# Patient Record
Sex: Female | Born: 1975 | Race: White | Hispanic: No | Marital: Married | Smoking: Never smoker
Health system: Southern US, Community
[De-identification: ages and names within clinical notes are randomized; demographics above are authoritative.]

## PROBLEM LIST (undated history)

## (undated) DIAGNOSIS — E079 Disorder of thyroid, unspecified: Secondary | ICD-10-CM

## (undated) DIAGNOSIS — N751 Abscess of Bartholin's gland: Secondary | ICD-10-CM

## (undated) DIAGNOSIS — E039 Hypothyroidism, unspecified: Secondary | ICD-10-CM

## (undated) DIAGNOSIS — Z973 Presence of spectacles and contact lenses: Secondary | ICD-10-CM

## (undated) HISTORY — PX: WISDOM TOOTH EXTRACTION: SHX21

## (undated) HISTORY — PX: RETINAL DETACHMENT SURGERY: SHX105

---

## 1997-12-08 HISTORY — PX: WISDOM TOOTH EXTRACTION: SHX21

## 2000-01-07 ENCOUNTER — Other Ambulatory Visit: Admission: RE | Admit: 2000-01-07 | Discharge: 2000-01-07 | Payer: Self-pay | Admitting: Obstetrics and Gynecology

## 2001-01-07 ENCOUNTER — Other Ambulatory Visit: Admission: RE | Admit: 2001-01-07 | Discharge: 2001-01-07 | Payer: Self-pay | Admitting: Obstetrics and Gynecology

## 2002-01-12 ENCOUNTER — Other Ambulatory Visit: Admission: RE | Admit: 2002-01-12 | Discharge: 2002-01-12 | Payer: Self-pay | Admitting: Obstetrics and Gynecology

## 2003-01-23 ENCOUNTER — Other Ambulatory Visit: Admission: RE | Admit: 2003-01-23 | Discharge: 2003-01-23 | Payer: Self-pay | Admitting: Obstetrics and Gynecology

## 2004-01-25 ENCOUNTER — Other Ambulatory Visit: Admission: RE | Admit: 2004-01-25 | Discharge: 2004-01-25 | Payer: Self-pay | Admitting: Obstetrics and Gynecology

## 2005-01-29 ENCOUNTER — Other Ambulatory Visit: Admission: RE | Admit: 2005-01-29 | Discharge: 2005-01-29 | Payer: Self-pay | Admitting: Obstetrics and Gynecology

## 2005-04-14 ENCOUNTER — Ambulatory Visit (HOSPITAL_COMMUNITY): Admission: RE | Admit: 2005-04-14 | Discharge: 2005-04-14 | Payer: Self-pay | Admitting: Obstetrics and Gynecology

## 2006-06-23 ENCOUNTER — Ambulatory Visit: Payer: Self-pay | Admitting: Endocrinology

## 2006-06-30 ENCOUNTER — Ambulatory Visit: Payer: Self-pay

## 2006-06-30 ENCOUNTER — Encounter: Payer: Self-pay | Admitting: Cardiology

## 2006-07-06 ENCOUNTER — Ambulatory Visit: Payer: Self-pay | Admitting: Endocrinology

## 2006-07-07 ENCOUNTER — Ambulatory Visit: Payer: Self-pay | Admitting: Endocrinology

## 2006-07-10 ENCOUNTER — Ambulatory Visit (HOSPITAL_COMMUNITY): Admission: RE | Admit: 2006-07-10 | Discharge: 2006-07-10 | Payer: Self-pay | Admitting: Endocrinology

## 2006-07-24 ENCOUNTER — Ambulatory Visit: Payer: Self-pay | Admitting: Endocrinology

## 2007-01-26 ENCOUNTER — Ambulatory Visit: Payer: Self-pay | Admitting: Endocrinology

## 2007-07-09 ENCOUNTER — Encounter: Payer: Self-pay | Admitting: Endocrinology

## 2007-07-09 DIAGNOSIS — E039 Hypothyroidism, unspecified: Secondary | ICD-10-CM | POA: Insufficient documentation

## 2007-07-27 ENCOUNTER — Ambulatory Visit: Payer: Self-pay | Admitting: Endocrinology

## 2007-10-29 ENCOUNTER — Ambulatory Visit: Payer: Self-pay | Admitting: Endocrinology

## 2007-11-01 ENCOUNTER — Telehealth (INDEPENDENT_AMBULATORY_CARE_PROVIDER_SITE_OTHER): Payer: Self-pay | Admitting: *Deleted

## 2007-11-01 LAB — CONVERTED CEMR LAB: TSH: 2.42 microintl units/mL (ref 0.35–5.50)

## 2008-01-18 ENCOUNTER — Telehealth (INDEPENDENT_AMBULATORY_CARE_PROVIDER_SITE_OTHER): Payer: Self-pay | Admitting: *Deleted

## 2008-01-24 ENCOUNTER — Encounter: Payer: Self-pay | Admitting: Endocrinology

## 2008-01-27 ENCOUNTER — Ambulatory Visit: Payer: Self-pay | Admitting: Endocrinology

## 2008-03-29 ENCOUNTER — Telehealth (INDEPENDENT_AMBULATORY_CARE_PROVIDER_SITE_OTHER): Payer: Self-pay | Admitting: *Deleted

## 2008-04-06 ENCOUNTER — Encounter: Payer: Self-pay | Admitting: Endocrinology

## 2008-05-03 ENCOUNTER — Ambulatory Visit: Payer: Self-pay | Admitting: Endocrinology

## 2008-06-30 ENCOUNTER — Encounter: Payer: Self-pay | Admitting: Endocrinology

## 2008-06-30 ENCOUNTER — Telehealth: Payer: Self-pay | Admitting: Endocrinology

## 2008-07-19 ENCOUNTER — Telehealth (INDEPENDENT_AMBULATORY_CARE_PROVIDER_SITE_OTHER): Payer: Self-pay | Admitting: *Deleted

## 2008-08-31 ENCOUNTER — Inpatient Hospital Stay (HOSPITAL_COMMUNITY): Admission: AD | Admit: 2008-08-31 | Discharge: 2008-09-03 | Payer: Self-pay | Admitting: Obstetrics and Gynecology

## 2008-08-31 ENCOUNTER — Encounter (INDEPENDENT_AMBULATORY_CARE_PROVIDER_SITE_OTHER): Payer: Self-pay | Admitting: Obstetrics and Gynecology

## 2008-09-13 ENCOUNTER — Telehealth: Payer: Self-pay | Admitting: Endocrinology

## 2008-10-14 ENCOUNTER — Encounter: Payer: Self-pay | Admitting: Endocrinology

## 2008-10-26 ENCOUNTER — Ambulatory Visit: Payer: Self-pay | Admitting: Endocrinology

## 2008-11-16 ENCOUNTER — Encounter: Payer: Self-pay | Admitting: Endocrinology

## 2008-11-17 ENCOUNTER — Telehealth: Payer: Self-pay | Admitting: Endocrinology

## 2008-11-20 ENCOUNTER — Telehealth (INDEPENDENT_AMBULATORY_CARE_PROVIDER_SITE_OTHER): Payer: Self-pay | Admitting: *Deleted

## 2009-01-05 ENCOUNTER — Encounter: Payer: Self-pay | Admitting: Endocrinology

## 2009-01-10 ENCOUNTER — Telehealth (INDEPENDENT_AMBULATORY_CARE_PROVIDER_SITE_OTHER): Payer: Self-pay | Admitting: *Deleted

## 2009-04-05 ENCOUNTER — Encounter: Payer: Self-pay | Admitting: Endocrinology

## 2009-04-10 ENCOUNTER — Encounter: Payer: Self-pay | Admitting: Endocrinology

## 2009-04-20 ENCOUNTER — Telehealth: Payer: Self-pay | Admitting: Internal Medicine

## 2009-05-08 ENCOUNTER — Ambulatory Visit: Payer: Self-pay | Admitting: Endocrinology

## 2009-05-25 ENCOUNTER — Encounter: Payer: Self-pay | Admitting: Endocrinology

## 2010-05-13 ENCOUNTER — Ambulatory Visit: Payer: Self-pay | Admitting: Endocrinology

## 2010-05-14 ENCOUNTER — Telehealth: Payer: Self-pay | Admitting: Endocrinology

## 2010-05-17 ENCOUNTER — Telehealth: Payer: Self-pay | Admitting: Endocrinology

## 2010-05-24 ENCOUNTER — Telehealth (INDEPENDENT_AMBULATORY_CARE_PROVIDER_SITE_OTHER): Payer: Self-pay | Admitting: *Deleted

## 2011-01-07 NOTE — Progress Notes (Signed)
  Phone Note Call from Patient   Caller: Patient Reason for Call: Lab or Test Results Action Taken: Phone Call Completed Details for Reason: Patient wanted her lab test results mailed to the address provided on the medical release form she completed. Details of Complaint: Patient is stating that in stead of receiving her labs in the mail, she received another release form. Summary of Call: Autumn Duffy called earlier in the week stating that she wanted her labs mailed to her. I explained to the patient that she needed to complete a medical release form. The patient the notified me that she had completed one already. I asked for her name, after researching, I realized I had mailed her results the same day and advised the patient of that. Autumn Duffy called back today, 05/24/10 stating that she had not received her lab results, instead she received another medical release. I advised her that based on the most current release form, it shows I mailed 3 pages to the address provided on the release. Initial call taken by: Wilder Glade,  May 24, 2010 10:05 AM

## 2011-01-07 NOTE — Progress Notes (Signed)
Summary: PT?  Phone Note Call from Patient   Caller: Patient [phone] Summary of Call: pt called stating that she forgot to inform MD at last OV that she has been having irregular menses with cycles longer than 28 days, sometimes 35 days. Please advise? Initial call taken by: Margaret Pyle, CMA,  May 14, 2010 2:37 PM  Follow-up for Phone Call        thank you.  thyroid blood test is right down the middle.  please continue same medication. Follow-up by: Minus Breeding MD,  May 14, 2010 3:46 PM  Additional Follow-up for Phone Call Additional follow up Details #1::        pt informed Additional Follow-up by: Margaret Pyle, CMA,  May 14, 2010 3:48 PM

## 2011-01-07 NOTE — Progress Notes (Signed)
Summary: Results  Phone Note Call from Patient Call back at [phone]   Caller: Patient Reason for Call: Lab or Test Results Summary of Call: Patient called request lab results, she states that they are not on phone tree. Patient would also like a call back regarding having a rx sent to [facility]..Marland KitchenAlvy Beal Archie CMA  May 17, 2010 2:35 PM   Follow-up for Phone Call        patient left message on triage again request lab results and that they be sent to her OB. Please advise.  Lucious Groves  May 17, 2010 2:38 PM  Follow-up by: Lucious Groves,  May 17, 2010 2:37 PM  Additional Follow-up for Phone Call Additional follow up Details #1::        it is not on phone-tree bcause it was addressed at 05/14/10 phone message.  result is 1.60 (well within normal).  ov (containing result) was already faxed to drs Dareen Piano and cresenzo.  i have faxed result again to dr Dareen Piano. Additional Follow-up by: Minus Breeding MD,  May 17, 2010 3:37 PM    Additional Follow-up for Phone Call Additional follow up Details #2::    Patient notified. [facility]. Follow-up by: Lucious Groves,  May 17, 2010 3:58 PM  Prescriptions: SYNTHROID 50 MCG TABS (LEVOTHYROXINE SODIUM) TAKE 1 by mouth QD  #90 x 3   Entered by:   Lucious Groves   Authorized by:   Minus Breeding MD   Signed by:   Lucious Groves on 05/17/2010   Method used:   Faxed to ...       [facility])             , [place]         Ph: [phone]       Fax: [phone]   RxID:   [id number]

## 2011-01-07 NOTE — Assessment & Plan Note (Signed)
Summary: 1 yr f/u per pt/cd   Vital Signs:  Patient profile:   35 year old female Height:      68 inches (172.72 cm) Weight:      158.6 pounds (72.09 kg) BMI:     24.20 O2 Sat:      97 % on Room air Temp:     98.6 degrees F (37.00 degrees C) oral Pulse rate:   64 / minute BP sitting:   102 / 62  (left arm) Cuff size:   regular  Vitals Entered By: Orlan Leavens (May 13, 2010 2:55 PM)  O2 Flow:  Room air CC: Yearly f/u on thyroid Is Patient Diabetic? No Pain Assessment Patient in pain? no        CC:  Yearly f/u on thyroid.  History of Present Illness: pt is now 20 months postpartum.  she says she is not considering another pregnancy.  pt states she feels well in general, except for weight gain and insomnia.    Current Medications (verified): 1)  Multivitamins   Tabs (Multiple Vitamin) .... Take 1 By Mouth Qd 2)  Vitamin C 500 Mg  Tabs (Ascorbic Acid) .... Take 1 By Mouth Qd 3)  Folic Acid 400 Mcg  Tabs (Folic Acid) .... Take 1 By Mouth Qd 4)  Fish Oil 1000 Mg  Caps (Omega-3 Fatty Acids) .... Take 1 By Mouth Qd 5)  Calcium 500 Mg  Tabs (Calcium) .... Take 2 By Mouth Qd 6)  Tsh 244.9 7)  Synthroid 50 Mcg Tabs (Levothyroxine Sodium) .... Take 1 By Mouth Qd  Allergies (verified): 1)  ! Penicillin  Past History:  Past Medical History: Last updated: 05/03/2008 HYPOTHYROIDISM (ICD-244.9)  Review of Systems       denies numbness.  Physical Exam  General:  normal appearance.   Neck:  no goiter Additional Exam:  FastTSH                   1.60 uIU/mL    Impression & Recommendations:  Problem # 1:  HYPOTHYROIDISM (ICD-244.9) well-replaced  Other Orders: TLB-TSH (Thyroid Stimulating Hormone) (84443-TSH) Est. Patient Level III ([address])  Patient Instructions: 1)  blood tests are being ordered for you today.  please call [phone] to hear your test results. 2)  pending the test results, please continue the same medication for now. 3)  cc drs Dareen Piano and cresenzo

## 2011-04-22 NOTE — Discharge Summary (Signed)
NAMEBRACHA, FRANKOWSKI               ACCOUNT NO.:  [id number]   MEDICAL RECORD NO.:  [id number]          PATIENT TYPE:  INP   LOCATION:  9102                          [facility]ACILITY:  [facility]   PHYSICIAN:  Gerrit Friends. Aldona Bar, M.D.   DATE OF BIRTH:  12-27-1975   DATE OF ADMISSION:  08/31/2008  DATE OF DISCHARGE:  09/03/2008                               DISCHARGE SUMMARY   DISCHARGE DIAGNOSES:  1. Term pregnancy, delivered 7-pound 8-ounce female infant, Apgars 9 and      9.  2. Blood type O positive.  3. Nonreassuring fetal heart tracing in labor.   PROCEDURE:  Primary low-transverse cesarean section.   SUMMARY:  This 35 year old gravida 2, now para 1 was delivered and was  admitted at term in labor.  She progressed and she became almost  complete because of persistent late decelerations.  She was taken to the  operating room for delivery by primary low-transverse cesarean section  by Dr. Dareen Piano on the morning of June 30, 2008.  She was delivered of a  7-pound 8-ounce female infant with good Apgar, and had an essentially  benign postoperative course.   Her discharge hemoglobin was 9.3 with a white count of 11,000 and a  platelet count of 103,000.  She remained afebrile during her hospital  course and on the morning of September 03, 2008, was ambulating well,  tolerating a regular diet well requiring minimal medication for pain and  was breast-feeding without difficulty.  Her incision was clean and dry.  She was having normal bowel and bladder function.  She was afebrile and  was desirous of discharge.  Accordingly, she was given all appropriate  instructions per discharge brochure and understood all instructions  well.  Discharge medications include; vitamins 1 a day as long as she is  breast-feeding, Feosol capsules - 1 daily, and she was given  prescription for Motrin 600 mg to use every 6 hours as needed for cramps  or discomfort.  Her staples were removed on the morning of discharge, we  removed Steri-Strip, and she was appropriately instructed.  She will  return to the office for followup in approximately 4 weeks' time or as  needed.   CONDITION ON DISCHARGE:  Improved.      Gerrit Friends. Aldona Bar, M.D.  Electronically Signed     RMW/MEDQ  D:  09/03/2008  T:  09/03/2008  Job:  [id number]

## 2011-04-22 NOTE — Consult Note (Signed)
[facility]                          ENDOCRINOLOGY CONSULTATION   Autumn Duffy, Autumn Duffy                      MRN:          [id number]  DATE:07/27/2007                            DOB:          August 10, 1976    REASON FOR VISIT:  Followup thyroid.   HISTORY OF PRESENT ILLNESS:  A 35 year old woman who now alternates her  Synthroid 75 mcg a day with 100 mcg a day.  She states this is because  I was unable to function when just taking Synthroid 75 mcg a day (this  is despite the fact that her TSH was normal when taking Synthroid 75 mcg  a day).   She also has a history of infertility for which she is seeing a  specialist at [facility].  She states an extensive workup was negative and  she is now pursuing IVF.   PAST MEDICAL HISTORY:  Otherwise healthy.   No other medications.   REVIEW OF SYSTEMS:  She has lost several pounds since her last visit a  few months ago.  She states she does that her menses had become slightly  less frequent than once a month recently.   PHYSICAL EXAMINATION:  VITAL SIGNS:  Blood pressure 106/68, heart rate  67, temperature is 97.1, the weight is 153.  GENERAL:  No distress.  She does not appear anxious nor depressed.  NECK:  The thyroid is minimally and diffusely enlarged.   IMPRESSION:  1. Chronic hypothyroidism.  2. Symptom of fatigue which the patient perceives is due to number      one.  3. Infertility which is not related to number one.   PLAN:  1. Change Synthroid to 88 mcg a day.  2. Recheck TSH in about 6 weeks.  3. I have asked her again to limit the number of TSH determinations      and adjustments in her medication.  4. I have asked her to buy into the fact that her symptoms are not      thyroid related.  5. Continue your treatment towards IVF at [facility].  6. Return here in about 6 months.     Sean A. Everardo All, MD  Electronically Signed    SAE/MedQ  DD: 07/29/2007  DT: 07/30/2007  Job #: [id number]   cc:    Malva Limes, M.D.

## 2011-04-22 NOTE — Op Note (Signed)
Autumn Duffy, JULIAN NO.:  [id number]   MEDICAL RECORD NO.:  [id number]          PATIENT TYPE:  INP   LOCATION:  9162                          [facility]ACILITY:  [facility]   PHYSICIAN:  Malva Limes, M.D.    DATE OF BIRTH:  May 30, 1976   DATE OF PROCEDURE:  08/31/2008  DATE OF DISCHARGE:                               OPERATIVE REPORT   PREOPERATIVE DIAGNOSES:  1. Intrauterine pregnancy at term.  2. Persistent late fetal heart rate decelerations.   POSTOPERATIVE DIAGNOSES:  1. Intrauterine pregnancy at term.  2. Persistent late fetal heart rate decelerations.   PROCEDURE:  Primary low-transverse cesarean section.   SURGEON:  Malva Limes, MD   ANESTHESIA:  Epidural.   ANTIBIOTIC:  Ancef 1 g.   DRAINS:  Foley bedside drainage.   ESTIMATED BLOOD LOSS:  900 mL.   COMPLICATIONS:  None.   FINDINGS:  The patient had normal fallopian tubes and ovaries  bilaterally.  Uterus appeared to be normal.  Placenta was slightly  small, had 3-vessel cord.  The patient delivered 1 live viable white  female infant.  Apgars 9 at 1 minute and 9 at 5 minutes.   SPECIMENS:  Placenta was sent to pathology.   PROCEDURE:  The patient was taken to the operating room where she was  placed in dorsal supine position with a left lateral tilt.  Once an  anesthetic level was reached, the patient was prepped with Betadine and  draped in usual fashion postprocedure.  A Pfannenstiel incision was made  2-cm above the pubic symphysis.  On entering the parietal peritoneum,  fluid was entering the parietal peritoneum.  The bladder flap was taken  down sharply.  A low-transverse uterine incision was made in the midline  and extended laterally with blunt dissection.  Amniotic fluid was noted  to be clear.  The infant was delivered in vertex presentation.  On  delivery, the head of the oropharynx and nostrils were bulb suctioned.  The remaining infant was then delivered.  There was no evidence of a  nuchal cord or abnormalities in the umbilical cord.  Once the baby was  delivered, the cord doubly clamped and cut, and the infant handed to the  awaiting NICU team.  Placenta was manually removed.  The uterus was  exteriorized.  The uterine cavity was cleaned with a wet lap.  The  uterine incision was closed in a single layer of 0 Monocryl in a running-  locking fashion.  The bladder flap was closed using 2-0 Monocryl in a  running fashion.  The uterus was placed back in the abdominal cavity.  Hemostasis was checked and felt to be adequate.  Parietal peritoneum and  rectus muscles were approximated in the midline using 2-0 Monocryl in a  running fashion.  The fascia was closed using 0 Monocryl suture in a  running fashion.  Subcuticular tissue was made hemostatic with the  Bovie.  Stainless steel clips were used to close the skin.  The patient  tolerated the procedure well.  She was taken to the recovery room in  stable condition.  Instrument, lap counts were correct x2.           ______________________________  Malva Limes, M.D.     MA/MEDQ  D:  08/31/2008  T:  08/31/2008  Job:  [id number]

## 2011-04-25 NOTE — Consult Note (Signed)
[facility]                            ENDOCRINOLOGY CONSULTATION   KIMBLE, DELAURENTIS                        MRN:          [id number]  DATE:06/23/2006                            DOB:          [birth year]    REFERRING PHYSICIAN:  Leatha Gilding. Mezer, MD   REFERRING PHYSICIAN:  Leatha Gilding. Mezer, MD   REASON FOR REFERRAL:  Hypothyroidism.   HISTORY OF PRESENT ILLNESS:  A 35 year old woman who states that in 2006 she  had a random blood test which showed her TSH elevated.  She felt well at  that time.  She was treated with Synthroid in dosages increasing from 25 to  50 and then 75 mcg a day.  She consulted Dr. Chevis Pretty because of infertility  and they found the TSH to be low and subsequently reduced the Synthroid to  50, then to 25 mcg a day.  Symptomatically, she has several months of slight  palpitations in the chest and associated mild heat intolerance.  No other  cause for her infertility has been found.  She now states that she  inconsistently takes the Synthroid 25 mcg a day.   PAST MEDICAL HISTORY:  Gravida 0.  She and her husband have been attempting  pregnancy for slightly over a year.  No other prescription medications.   SOCIAL HISTORY:  She is married.  She works in [employer]   FAMILY HISTORY:  Negative for thyroid disease.   REVIEW OF SYSTEMS:  Denies any change in weight.  Denies tremor.   PHYSICAL EXAMINATION:  VITAL SIGNS:  Blood pressure 110/71, heart rate 67,  temperature is 99.1, weight 150.  GENERAL:  No distress.  SKIN:  Normal texture and temperature.  EYES:  No proptosis.  No periorbital swelling.  NECK:  No goiter.  CHEST:  Clear to auscultation.  No respiratory distress.  CARDIOVASCULAR:  No JVD.  Trace bilateral pretibial edema.  Regular rate and  rhythm.  No murmur.  MUSCULOSKELETAL:  Gait is observed in the office to be normal.  NEUROLOGICAL:  Alert and oriented.  Does not appear anxious nor  depressed  and there is no tremor.   Electrocardiogram in our office shows high voltage in the lateral leads  consistent with left ventricular hypertrophy.   Laboratory studies forwarded by Drs. Cresenzo and Mezer show that her TSH on  June 08, 2006 was 0.015.  The other TSH values over the last 14 months or so  are reviewed also.   IMPRESSION:  1.  Mild hypothyroidism.  2.  I agree that her infertility demands normalization of her TSH.  3.  I am a little concerned about her abnormal electrocardiogram in the      context of her edema.   PLAN:  1.  We discussed hypothyroidism in its natural history and I gave her a      brochure.  2.  I have told her that for now, she should take the Synthroid 25 mcg a day      on a consistent basis and return here in 30 days.  She  will have a TSH      done at [facility] several days prior, and I      have given her a prescription for this.  3.  Check echocardiogram at [facility].                                   Sean A. Everardo All, MD   SAE/MedQ  DD:  06/23/2006  DT:  06/24/2006  Job #:  [id number]   cc:   Leatha Gilding. Mezer, MD  Patrica Duel, MD  Malva Limes, MD

## 2011-04-25 NOTE — Consult Note (Signed)
[facility]                            ENDOCRINOLOGY CONSULTATION   Autumn Duffy, Autumn Duffy                        MRN:          [id number]  DATE:07/07/2006                            DOB:          1976/09/06    REASON FOR VISIT:  Follow up thyroid.   HISTORY OF PRESENT ILLNESS:  A 35 year old woman who last night had a 2-hour  episode of palpitation and some associated heat intolerance.  She is now  taking Synthroid just 25 mcg a day.   PAST MEDICAL HISTORY:  Same as June 23, 2006.   REVIEW OF SYSTEMS:  Denies fever and chest pain.   PHYSICAL EXAMINATION:  VITAL SIGNS:  Blood pressure 105/72, heart rate 72,  temperature 99.2, weight 153.  GENERAL:  No distress.  She appears slightly anxious.  NECK:  No goiter is palpable.  CHEST:  Clear to auscultation.   STUDIES:  Electrocardiogram in the office today is normal.   IMPRESSION:  Episode of palpitations in a patient with a history of varying  requirement for Synthroid.   PLAN:  1.  Recheck TSH.  2.  Return in a few weeks as scheduled.  3.  Check thyroid ultrasound.                                   Sean A. Everardo All, MD   SAE/MedQ  DD:  07/08/2006  DT:  07/08/2006  Job #:  [id number]   cc:   Leatha Gilding. Mezer, MD  Patrica Duel, MD  Malva Limes, MD

## 2011-04-25 NOTE — Consult Note (Signed)
[facility]                          ENDOCRINOLOGY CONSULTATION   Autumn Duffy, Autumn Duffy                      MRN:          [id number]  DATE:03/04/2007                            DOB:          09-07-1976    TELEPHONE CALL:  The patient calls to follow up her TSH value of 0.925  collected on March 01, 2007.  She has had a number of TSH determinations  over the past year or so.  I told her that while I wish her the best  regarding her infertility, I am concerned about excessive adjustments in  her thyroid hormone replacement.  I advised her to continue her  Synthroid at 100 mcg a day and have another TSH in about 3 months.     Sean A. Everardo All, MD  Electronically Signed    SAE/MedQ  DD: 03/07/2007  DT: 03/07/2007  Job #: [id number]   cc:   Malva Limes, M.D.

## 2011-04-25 NOTE — Consult Note (Signed)
[facility]                            ENDOCRINOLOGY CONSULTATION   JANAYAH, ZAVADA                      MRN:          [id number]  DATE:07/24/2006                            DOB:          1976-08-23    REASON FOR VISIT:  Followup of thyroid.   HISTORY OF PRESENT ILLNESS:  This is a 35 year old woman who states she is  feeling much better and her palpitations have resolved.  She is currently  taking Synthroid 25 mcg a day.   PAST MEDICAL HISTORY:  Same as June 23, 2006.   REVIEW OF SYSTEMS:  Denies any change in her weight.   PHYSICAL EXAMINATION:  VITAL SIGNS: Blood pressure 106/67.  Heart rate 71.  Temperature 98.4.  Weight 156.  GENERAL:  No distress.  SKIN:  Not diaphoretic.  NECK:  No goiter.  NEUROLOGIC:  Alert and well-oriented.  Does not appear anxious nor  depressed.   LABORATORY STUDIES:  On July 24, 2006, TSH was normal at 4.51.   IMPRESSION:  Mild hypothyroidism, well replaced on Synthroid 25 mcg a day.   PLAN:  1. Same Synthroid.  2. Return in six months, or if she becomes pregnant sooner she should      return then.                                   Sean A. Everardo All, MD   SAE/MedQ  DD:  07/26/2006  DT:  07/26/2006  Job #:  [id number]   cc:   Leatha Gilding. Mezer, MD  Patrica Duel, MD  Malva Limes, MD

## 2011-04-25 NOTE — Consult Note (Signed)
[facility]                          ENDOCRINOLOGY CONSULTATION   Autumn Duffy, Autumn Duffy                      MRN:          [id number]  DATE:01/26/2007                            DOB:          05/17/1976    REASON FOR VISIT:  Follow up thyroid.   HISTORY OF PRESENT ILLNESS:  The patient is a 35 year old woman with a  history of hypothyroidism, who has most recently been taking Synthroid  75 micrograms a day and her TSH levels have been normal.  However, she  states that, on the advice of a pharmacist, she began varying the dosage  of the Synthroid according to how she felt on a particular day.  Thus,  she takes 75 micrograms on some days and 100 micrograms on others.  She  states she generally feels better when she takes 100 micrograms a day.   PAST MEDICAL HISTORY:  She states she continues to be at risk for  pregnancy, but is no longer actively pursuing this.   REVIEW OF SYSTEMS:  She has gained several pounds since I last saw her,  six months ago.   PHYSICAL EXAMINATION:  Blood pressure is 110/74, heart rate 64,  temperature is 98.7, the weight is 160.  GENERAL:  No distress.  NECK:  No goiter.  SKIN:  Normal texture and temperature.  NEUROLOGIC:  No tremor.   LABORATORY STUDIES:  Most recently, TSH on January 18, 2007, was 1.9 at  [facility], where she works.   IMPRESSION:  1. Chronic hypothyroidism.  2. This patient appears to have somewhat of an obsession with her      thyroid disorder and this obsession appears to drive some false      perceptions about what therapy would be best for her.   PLAN:  1. I encouraged her to, for now, just take Synthroid 100 micrograms a      day and not vary it.  2. Recheck TSH four to six weeks.  3. Return here six months.     Sean A. Everardo All, MD  Electronically Signed    SAE/MedQ  DD: 01/26/2007  DT: 01/27/2007  Job #: [id number]   cc:   Patrica Duel, M.D.  Malva Limes, M.D.

## 2011-09-08 LAB — CBC
HCT: 34.1 — ABNORMAL LOW
Hemoglobin: 9.3 — ABNORMAL LOW
MCHC: 34.3
MCV: 94.7
Platelets: 124 — ABNORMAL LOW
RDW: 13.3

## 2013-10-27 ENCOUNTER — Other Ambulatory Visit: Payer: Self-pay | Admitting: Obstetrics and Gynecology

## 2014-01-10 ENCOUNTER — Other Ambulatory Visit: Payer: Self-pay

## 2014-03-17 ENCOUNTER — Ambulatory Visit: Payer: BC Managed Care – PPO | Admitting: Podiatrist

## 2014-03-17 ENCOUNTER — Encounter: Payer: Self-pay | Admitting: Podiatrist

## 2014-03-17 ENCOUNTER — Ambulatory Visit (INDEPENDENT_AMBULATORY_CARE_PROVIDER_SITE_OTHER): Payer: BC Managed Care – PPO

## 2014-03-17 VITALS — BP 112/61 | HR 74 | Resp 16 | Ht 68.0 in | Wt 159.0 lb

## 2014-03-17 DIAGNOSIS — M79609 Pain in unspecified limb: Secondary | ICD-10-CM

## 2014-03-17 DIAGNOSIS — M779 Enthesopathy, unspecified: Secondary | ICD-10-CM

## 2014-03-17 DIAGNOSIS — M79673 Pain in unspecified foot: Secondary | ICD-10-CM

## 2014-03-17 DIAGNOSIS — M898X9 Other specified disorders of bone, unspecified site: Secondary | ICD-10-CM

## 2014-03-17 NOTE — Patient Instructions (Signed)
You have been prescribed a topical pain medications through a specialty compounding pharmacy called [facility] out of [place], [place]. A representative from the pharmacy will call to ensure you would like to receive the medication. If you do not hear from them within two business days please call to confirm they have received the prescription. Their telephone number is [phone].    If you have any other questions or concerns regarding the medication please call our office.  

## 2014-03-17 NOTE — Progress Notes (Signed)
   Subjective:    Patient ID: Saundra ShellingJennifer D Allmon, female    DOB: [birth year], 38 y.o.   MRN: [id number]  HPI Comments: "I have this little spot here that get red."  Patient c/o tender 5th MPJ right for about 1 year. She says the area get swollen and red off and on. Her shoes rub occasionally. It has a tiny callused spot over it.   Foot Pain      Review of Systems  All other systems reviewed and are negative.      Objective:   Physical Exam GENERAL APPEARANCE: Alert, conversant. Appropriately groomed. No acute distress.  VASCULAR: Pedal pulses palpable at 2/4 DP and PT bilateral.  Capillary refill time is immediate to all digits,  Proximal to distal cooling it warm to warm.  Digital hair growth is present bilateral  NEUROLOGIC: sensation is intact epicritically and protectively to 5.07 monofilament at 5/5 sites bilateral.  Light touch is intact bilateral, vibratory sensation intact bilateral, achilles tendon reflex is intact bilateral.  MUSCULOSKELETAL: small exostosis present on the dorsal aspect of the 5th metatarsal head cooinciding with the area of irritation.  Otherwise acceptable muscle strength, tone and stability bilateral.  Intrinsic muscluature intact bilateral.  Rectus appearance of foot and digits noted bilateral.   DERMATOLOGIC: small hyperkeratotic lesion/ area of irritation is present on the dorsal aspect of the 5th metatarsal head right.  xrays confirm a small boney exostisis that would rub in shoe gear.  No redness, swelling or signs of infection present    Assessment & Plan:  exostisis with skin irritation  Plan:  Recommended looser shoes and recommended topical antiinflammatory cream for when the area gets irritated in the future.  Rx written through [facility].  She will be seen prn

## 2014-03-28 ENCOUNTER — Telehealth: Payer: Self-pay | Admitting: *Deleted

## 2014-03-28 NOTE — Telephone Encounter (Signed)
Prescription was sent to [facility] for a compound (BDV) Tendinosis- Strictures-Scarring Baclofen 2%, Bupivacaine 1%, Diclofenac 5%, and Gabapentin 6% Qty. 240gm, 2 Refills, Diagnosis: Bursitis  [facility] [address] [place], [place] [address] Ph: [phone]

## 2014-04-21 DIAGNOSIS — M779 Enthesopathy, unspecified: Secondary | ICD-10-CM

## 2014-10-31 ENCOUNTER — Other Ambulatory Visit: Payer: Self-pay | Admitting: Obstetrics and Gynecology

## 2014-11-03 LAB — CYTOLOGY - PAP

## 2014-11-27 ENCOUNTER — Encounter (HOSPITAL_COMMUNITY): Payer: Self-pay | Admitting: Emergency Medicine

## 2014-11-27 ENCOUNTER — Emergency Department (HOSPITAL_COMMUNITY)
Admission: EM | Admit: 2014-11-27 | Discharge: 2014-11-27 | Disposition: A | Discharge status: Home / Self Care | Payer: BC Managed Care – PPO | Attending: Emergency Medicine | Admitting: Emergency Medicine

## 2014-11-27 DIAGNOSIS — S46819A Strain of other muscles, fascia and tendons at shoulder and upper arm level, unspecified arm, initial encounter: Secondary | ICD-10-CM

## 2014-11-27 DIAGNOSIS — Y92410 Unspecified street and highway as the place of occurrence of the external cause: Secondary | ICD-10-CM | POA: Insufficient documentation

## 2014-11-27 DIAGNOSIS — Z880 Allergy status to penicillin: Secondary | ICD-10-CM | POA: Insufficient documentation

## 2014-11-27 DIAGNOSIS — Y9389 Activity, other specified: Secondary | ICD-10-CM | POA: Insufficient documentation

## 2014-11-27 DIAGNOSIS — Z79899 Other long term (current) drug therapy: Secondary | ICD-10-CM | POA: Diagnosis not present

## 2014-11-27 DIAGNOSIS — S29012A Strain of muscle and tendon of back wall of thorax, initial encounter: Secondary | ICD-10-CM | POA: Diagnosis not present

## 2014-11-27 DIAGNOSIS — Y998 Other external cause status: Secondary | ICD-10-CM | POA: Diagnosis not present

## 2014-11-27 DIAGNOSIS — S199XXA Unspecified injury of neck, initial encounter: Secondary | ICD-10-CM | POA: Diagnosis present

## 2014-11-27 HISTORY — DX: Disorder of thyroid, unspecified: E07.9

## 2014-11-27 NOTE — ED Notes (Signed)
c-collar placed in triage

## 2014-11-27 NOTE — Discharge Instructions (Signed)
If you were given medicines take as directed.  If you are on coumadin or contraceptives realize their levels and effectiveness is altered by many different medicines.  If you have any reaction (rash, tongues swelling, other) to the medicines stop taking and see a physician.   Please follow up as directed and return to the ER or see a physician for new or worsening symptoms.  Thank you. Filed Vitals:   11/27/14 1831 11/27/14 1833  BP: 126/61   Pulse: 90   Temp: 98.6 F (37 C)   TempSrc: Oral   Resp: 20   Height:  5\' 8"  (1.727 m)  Weight:  150 lb (68.04 kg)  SpO2: 100%     Return for any changes, weird rashes, bowel or bladder changes/ incontinence, leg or arm weakness, neck stiffness, change in behavior, new or worsening concerns.  Follow up with your physician as directed. Thank you
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
## 2014-12-19 ENCOUNTER — Inpatient Hospital Stay (HOSPITAL_COMMUNITY)
Admission: AD | Admit: 2014-12-19 | Discharge: 2014-12-19 | Disposition: A | Discharge status: Home / Self Care | Payer: BLUE CROSS/BLUE SHIELD | Source: Ambulatory Visit | Attending: Obstetrics & Gynecology | Admitting: Obstetrics & Gynecology

## 2014-12-19 ENCOUNTER — Encounter (HOSPITAL_COMMUNITY): Payer: Self-pay | Admitting: *Deleted

## 2014-12-19 DIAGNOSIS — M7918 Myalgia, other site: Secondary | ICD-10-CM

## 2014-12-19 DIAGNOSIS — M791 Myalgia: Secondary | ICD-10-CM | POA: Insufficient documentation

## 2014-12-19 DIAGNOSIS — N750 Cyst of Bartholin's gland: Secondary | ICD-10-CM | POA: Diagnosis not present

## 2014-12-19 DIAGNOSIS — N949 Unspecified condition associated with female genital organs and menstrual cycle: Secondary | ICD-10-CM | POA: Diagnosis present

## 2014-12-19 LAB — URINALYSIS, ROUTINE W REFLEX MICROSCOPIC
BILIRUBIN URINE: NEGATIVE
Glucose, UA: NEGATIVE mg/dL
Hgb urine dipstick: NEGATIVE
Ketones, ur: NEGATIVE mg/dL
LEUKOCYTES UA: NEGATIVE
NITRITE: NEGATIVE
PH: 6 (ref 5.0–8.0)
Protein, ur: NEGATIVE mg/dL
SPECIFIC GRAVITY, URINE: 1.01 (ref 1.005–1.030)
UROBILINOGEN UA: 0.2 mg/dL (ref 0.0–1.0)

## 2014-12-19 MED ORDER — OXYCODONE-ACETAMINOPHEN 5-325 MG PO TABS: 1.0000 | ORAL_TABLET | ORAL | Status: DC | PRN

## 2014-12-19 MED ORDER — OXYCODONE-ACETAMINOPHEN 5-325 MG PO TABS
2.0000 | ORAL_TABLET | Freq: Once | ORAL | Status: AC
  Administered 2014-12-19: 2 via ORAL
  Filled 2014-12-19: qty 2

## 2014-12-19 NOTE — Discharge Instructions (Signed)
Bartholin's Cyst or Abscess °Bartholin's glands are small glands located within the folds of skin (labia) along the sides of the lower opening of the vagina (birth canal). A cyst may develop when the duct of the gland becomes blocked. When this happens, fluid that accumulates within the cyst can become infected. This is known as an abscess. The Bartholin gland produces a mucous fluid to lubricate the outside of the vagina during sexual intercourse. °SYMPTOMS  °· Patients with a small cyst may not have any symptoms. °· Mild discomfort to severe pain depending on the size of the cyst and if it is infected (abscess). °· Pain, redness, and swelling around the lower opening of the vagina. °· Painful intercourse. °· Pressure in the perineal area. °· Swelling of the lips of the vagina (labia). °· The cyst or abscess can be on one side or both sides of the vagina. °DIAGNOSIS  °· A large swelling is seen in the lower vagina area by your caregiver. °· Painful to touch. °· Redness and pain, if it is an abscess. °TREATMENT  °· Sometimes the cyst will go away on its own. °· Apply warm wet compresses to the area or take hot sitz baths several times a day. °· An incision to drain the cyst or abscess with local anesthesia. °· Culture the pus, if it is an abscess. °· Antibiotic treatment, if it is an abscess. °· Cut open the gland and suture the edges to make the opening of the gland bigger (marsupialization). °· Remove the whole gland if the cyst or abscess returns. °PREVENTION  °· Practice good hygiene. °· Clean the vaginal area with a mild soap and soft cloth when bathing. °· Do not rub hard in the vaginal area when bathing. °· Protect the crotch area with a padded cushion if you take long bike rides or ride horses. °· Be sure you are well lubricated when you have sexual intercourse. °HOME CARE INSTRUCTIONS  °· If your cyst or abscess was opened, a small piece of gauze, or a drain, may have been placed in the wound to allow  drainage. Do not remove this gauze or drain unless directed by your caregiver. °· Wear feminine pads, not tampons, as needed for any drainage or bleeding. °· If antibiotics were prescribed, take them exactly as directed. Finish the entire course. °· Only take over-the-counter or prescription medicines for pain, discomfort, or fever as directed by your caregiver. °SEEK IMMEDIATE MEDICAL CARE IF:  °· You have an increase in pain, redness, swelling, or drainage. °· You have bleeding from the wound which results in the use of more than the number of pads suggested by your caregiver in 24 hours. °· You have chills. °· You have a fever. °· You develop any new problems (symptoms) or aggravation of your existing condition. °MAKE SURE YOU:  °· Understand these instructions. °· Will watch your condition. °· Will get help right away if you are not doing well or get worse. °Document Released: 11/24/2005 Document Revised: 02/16/2012 Document Reviewed: 07/12/2008 °ExitCare® Patient Information ©2015 [employer]. This information is not intended to replace advice given to you by your health care provider. Make sure you discuss any questions you have with your health care provider. ° °

## 2014-12-19 NOTE — MAU Provider Note (Signed)
Chief Complaint: No chief complaint on file.   First Provider Initiated Contact with Patient 12/19/14 1500     SUBJECTIVE HPI: Autumn Duffy is a 39 y.o. [id number] female  who presents to maternity admissions reporting having a painful knot on the right side of her vagina 3 days that has grown progressively more enlarged and tender. Also reports right low back pain for the same period of time. Describes it as feeling sore, as if she needs to rub it. Not flank pain. No history of similar symptoms. Denies drainage or bleeding from the mass. Denies dysuria, urgency, frequency, hematuria or recent injury. Has not taken temperature. Patient states he is in a mutually monogamous relationship. Had been able to manage the pain with Tylenol, but now it's not working.  Past Medical History  Diagnosis Date  . Thyroid disease    OB History  Gravida Para Term Preterm AB SAB TAB Ectopic Multiple Living  2    1 1    1     # Outcome Date GA Lbr Len/2nd Weight Sex Delivery Anes PTL Lv  2 Gravida           1 SAB              Past Surgical History  Procedure Laterality Date  . Cesarean section    . Retinal detachment surgery    . Wisdom tooth extraction     History   Social History  . Marital Status: Married    Spouse Name: N/A    Number of Children: N/A  . Years of Education: N/A   Occupational History  . Not on file.   Social History Main Topics  . Smoking status: Never Smoker   . Smokeless tobacco: Not on file  . Alcohol Use: No  . Drug Use: No  . Sexual Activity: Yes   Other Topics Concern  . Not on file   Social History Narrative   No current facility-administered medications on file prior to encounter.   Current Outpatient Prescriptions on File Prior to Encounter  Medication Sig Dispense Refill  . levothyroxine (SYNTHROID, LEVOTHROID) 75 MCG tablet Take 75 mcg by mouth daily before breakfast.     Allergies  Allergen Reactions  . Penicillins Rash    Childhood reaction.     ROS: Pertinent items in HPI. Otherwise negative.  OBJECTIVE Blood pressure 104/55, pulse 94, temperature 99.4 F (37.4 C), temperature source Oral, last menstrual period 11/10/2014. GENERAL: Well-developed, well-nourished female in mild distress. More with movement or sitting. HEENT: Normocephalic HEART: normal rate RESP: normal effort ABDOMEN: Soft, non-tender. Positive bowel sounds. BACK: Negative CVA tenderness. Right low back non-tender. Normal range of motion. EXTREMITIES: Nontender, no edema NEURO: Alert and oriented SPECULUM EXAM: NEFG except for 4 cm, tender, nonfluctuant, mass in the right lower introitus. No erythema, drainage or bleeding. Physiologic discharge, no blood noted BIMANUAL: declined  LAB RESULTS Results for orders placed or performed during the hospital encounter of 12/19/14 (from the past 24 hour(s))  Urinalysis, Routine w reflex microscopic     Status: None   Collection Time: 12/19/14  3:10 PM  Result Value Ref Range   Color, Urine YELLOW YELLOW   APPearance CLEAR CLEAR   Specific Gravity, Urine 1.010 1.005 - 1.030   pH 6.0 5.0 - 8.0   Glucose, UA NEGATIVE NEGATIVE mg/dL   Hgb urine dipstick NEGATIVE NEGATIVE   Bilirubin Urine NEGATIVE NEGATIVE   Ketones, ur NEGATIVE NEGATIVE mg/dL   Protein, ur NEGATIVE NEGATIVE mg/dL  Urobilinogen, UA 0.2 0.0 - 1.0 mg/dL   Nitrite NEGATIVE NEGATIVE   Leukocytes, UA NEGATIVE NEGATIVE    IMAGING No results found.  MAU COURSE Not able to drain Bartholin's cyst today because it is not fluctuant. Pain medication given here and prescription given for home.  ASSESSMENT 1. Bartholin's gland cyst   2. Musculoskeletal pain     PLAN Discharge home in stable condition per consult with Dr. Steele Berg. Warm compresses or soaks 5 or more times per day. Return to admission for fever greater than 100.4 or severe pain not controlled with Percocet.      Follow-up Information    Follow up with Levi Aland, MD In 2  days.   Specialty:  Obstetrics and Gynecology   Why:  see if cyst is ready to drain.    Contact information:   [address] [place] [address] [phone]       Follow up with [facility].   Why:  As needed in emergencies   Contact information:   [address] [id number] mc [place] [address] [phone]       Medication List    STOP taking these medications        acetaminophen 500 MG tablet  Commonly known as:  TYLENOL      TAKE these medications        levothyroxine 75 MCG tablet  Commonly known as:  SYNTHROID, LEVOTHROID  Take 75 mcg by mouth daily before breakfast.     multivitamin with minerals Tabs tablet  Take 1 tablet by mouth daily.     oxyCODONE-acetaminophen 5-325 MG per tablet  Commonly known as:  PERCOCET/ROXICET  Take 1-2 tablets by mouth every 4 (four) hours as needed.         [place], [place] 12/19/2014  3:42 PM

## 2014-12-19 NOTE — MAU Note (Signed)
Pt states a knot  to right side of vaginal area started coming up on Sunday but last it became more tender and larger.  Now size of quarter or half dollar size.  Denies vagina bleeeding or discharge.

## 2014-12-22 ENCOUNTER — Inpatient Hospital Stay (HOSPITAL_COMMUNITY)
Admission: AD | Admit: 2014-12-22 | Discharge: 2014-12-24 | DRG: 747 | Disposition: A | Discharge status: Home / Self Care | Payer: BLUE CROSS/BLUE SHIELD | Source: Ambulatory Visit | Attending: Obstetrics and Gynecology | Admitting: Obstetrics and Gynecology

## 2014-12-22 ENCOUNTER — Inpatient Hospital Stay (HOSPITAL_COMMUNITY): Payer: BLUE CROSS/BLUE SHIELD

## 2014-12-22 ENCOUNTER — Ambulatory Visit (HOSPITAL_COMMUNITY): Payer: BLUE CROSS/BLUE SHIELD

## 2014-12-22 ENCOUNTER — Encounter (HOSPITAL_COMMUNITY): Payer: Self-pay | Admitting: General Practice

## 2014-12-22 DIAGNOSIS — Z880 Allergy status to penicillin: Secondary | ICD-10-CM | POA: Diagnosis not present

## 2014-12-22 DIAGNOSIS — K649 Unspecified hemorrhoids: Secondary | ICD-10-CM | POA: Diagnosis present

## 2014-12-22 DIAGNOSIS — N764 Abscess of vulva: Secondary | ICD-10-CM | POA: Diagnosis present

## 2014-12-22 LAB — CBC
HEMATOCRIT: 34.4 % — AB (ref 36.0–46.0)
Hemoglobin: 10.9 g/dL — ABNORMAL LOW (ref 12.0–15.0)
MCH: 28.8 pg (ref 26.0–34.0)
MCHC: 31.7 g/dL (ref 30.0–36.0)
MCV: 90.8 fL (ref 78.0–100.0)
Platelets: 209 10*3/uL (ref 150–400)
RBC: 3.79 MIL/uL — ABNORMAL LOW (ref 3.87–5.11)
RDW: 13.3 % (ref 11.5–15.5)
WBC: 7.4 10*3/uL (ref 4.0–10.5)

## 2014-12-22 LAB — COMPREHENSIVE METABOLIC PANEL
ALBUMIN: 3.7 g/dL (ref 3.5–5.2)
ALT: 13 U/L (ref 0–35)
AST: 13 U/L (ref 0–37)
Alkaline Phosphatase: 56 U/L (ref 39–117)
Anion gap: 7 (ref 5–15)
BUN: 7 mg/dL (ref 6–23)
CO2: 27 mmol/L (ref 19–32)
CREATININE: 0.55 mg/dL (ref 0.50–1.10)
Calcium: 8.9 mg/dL (ref 8.4–10.5)
Chloride: 106 mEq/L (ref 96–112)
GFR calc Af Amer: >90 mL/min (ref >90)
GFR calc non Af Amer: >90 mL/min (ref >90)
Glucose, Bld: 102 mg/dL — ABNORMAL HIGH (ref 70–99)
POTASSIUM: 4.2 mmol/L (ref 3.5–5.1)
Sodium: 140 mmol/L (ref 135–145)
TOTAL PROTEIN: 7 g/dL (ref 6.0–8.3)
Total Bilirubin: 0.3 mg/dL (ref 0.3–1.2)

## 2014-12-22 LAB — PROTIME-INR
INR: 1 (ref 0.00–1.49)
PROTHROMBIN TIME: 13.2 s (ref 11.6–15.2)

## 2014-12-22 LAB — APTT: aPTT: 30 seconds (ref 24–37)

## 2014-12-22 MED ORDER — BUTORPHANOL TARTRATE 1 MG/ML IJ SOLN: 1.0000 mg | INTRAMUSCULAR | Status: DC | PRN

## 2014-12-22 MED ORDER — CEFAZOLIN SODIUM-DEXTROSE 2-3 GM-% IV SOLR
2.0000 g | Freq: Four times a day (QID) | INTRAVENOUS | Status: DC
  Administered 2014-12-22 – 2014-12-24 (×7): 2 g via INTRAVENOUS
  Filled 2014-12-22 (×8): qty 50

## 2014-12-22 MED ORDER — CEFAZOLIN SODIUM-DEXTROSE 2-3 GM-% IV SOLR
2.0000 g | Freq: Four times a day (QID) | INTRAVENOUS | Status: DC
  Administered 2014-12-22: 2 g via INTRAVENOUS
  Filled 2014-12-22 (×3): qty 50

## 2014-12-22 MED ORDER — PRENATAL MULTIVITAMIN CH
1.0000 | ORAL_TABLET | Freq: Every day | ORAL | Status: DC
  Administered 2014-12-23: 1 via ORAL
  Filled 2014-12-22: qty 1

## 2014-12-22 MED ORDER — DOCUSATE SODIUM 100 MG PO CAPS
100.0000 mg | ORAL_CAPSULE | Freq: Once | ORAL | Status: AC
  Administered 2014-12-22: 100 mg via ORAL
  Filled 2014-12-22: qty 1

## 2014-12-22 MED ORDER — PROMETHAZINE HCL 25 MG PO TABS: 25.0000 mg | ORAL_TABLET | Freq: Four times a day (QID) | ORAL | Status: DC | PRN

## 2014-12-22 MED ORDER — LEVOTHYROXINE SODIUM 75 MCG PO TABS
75.0000 ug | ORAL_TABLET | Freq: Every day | ORAL | Status: DC
  Administered 2014-12-23 – 2014-12-24 (×2): 75 ug via ORAL
  Filled 2014-12-22 (×2): qty 1

## 2014-12-22 MED ORDER — MIDAZOLAM HCL 2 MG/2ML IJ SOLN
INTRAMUSCULAR | Status: DC | PRN
  Administered 2014-12-22 (×2): 1 mg via INTRAVENOUS

## 2014-12-22 MED ORDER — OXYCODONE-ACETAMINOPHEN 5-325 MG PO TABS
1.0000 | ORAL_TABLET | ORAL | Status: DC | PRN
  Administered 2014-12-22: 1 via ORAL
  Filled 2014-12-22: qty 1

## 2014-12-22 MED ORDER — FENTANYL CITRATE 0.05 MG/ML IJ SOLN
INTRAMUSCULAR | Status: DC | PRN
  Administered 2014-12-22 (×3): 50 ug via INTRAVENOUS

## 2014-12-22 NOTE — Progress Notes (Signed)
[facility] at bedside to transport pt to [facility].

## 2014-12-22 NOTE — Consult Note (Signed)
Chief Complaint: Chief Complaint  Patient presents with  . Hemorrhoids   Vulvar abscess  Referring Physician(s): Dr Henderson CloudHorvath  History of Present Illness: Autumn Duffy is a 39 y.o. female  Pt noted pain in groin Sunday pm Mon worsened Was able to feel knot Tues Wed had aspiration in MD office Pain did relieve slightly But worsening again New US reveals collection Rt labial region Request for aspiration with possible drain placement Imaging reviewed by Dr Grace IsaacWatts Approved procedure I have seen and examined pt    Past Medical History  Diagnosis Date  . Thyroid disease     Past Surgical History  Procedure Laterality Date  . Cesarean section    . Retinal detachment surgery    . Wisdom tooth extraction      Allergies: Penicillins  Medications: Prior to Admission medications   Medication Sig Start Date End Date Taking? Authorizing Provider  levothyroxine (SYNTHROID, LEVOTHROID) 75 MCG tablet Take 75 mcg by mouth daily before breakfast.   Yes Historical Provider, MD    History reviewed. No pertinent family history.  History   Social History  . Marital Status: Married    Spouse Name: N/A    Number of Children: N/A  . Years of Education: N/A   Social History Main Topics  . Smoking status: Never Smoker   . Smokeless tobacco: None  . Alcohol Use: No  . Drug Use: No  . Sexual Activity: Yes   Other Topics Concern  . None   Social History Narrative      Review of Systems: A 12 point ROS discussed and pertinent positives are indicated in the HPI above.  All other systems are negative.  Review of Systems  Constitutional: Positive for activity change. Negative for fever.  Respiratory: Negative for shortness of breath.   Gastrointestinal: Negative for abdominal pain.  Genitourinary: Negative for vaginal bleeding, vaginal discharge and difficulty urinating.  Psychiatric/Behavioral: Negative for behavioral problems and confusion.    Vital Signs: BP  116/74 mmHg  Pulse 84  Temp(Src) 98.2 F (36.8 C) (Oral)  Resp 14  Ht 5\' 9"  (1.753 m)  Wt 72.576 kg (160 lb)  BMI 23.62 kg/m2  SpO2 100%  LMP 12/11/2014 (Exact Date)  Physical Exam  Constitutional: She is oriented to person, place, and time. She appears well-nourished.  Cardiovascular: Normal rate, regular rhythm and normal heart sounds.   No murmur heard. Pulmonary/Chest: Effort normal and breath sounds normal. She has no wheezes.  Abdominal: Soft. Bowel sounds are normal. There is no tenderness.  Musculoskeletal: Normal range of motion.  Neurological: She is alert and oriented to person, place, and time.  Skin: Skin is warm and dry.  Psychiatric: She has a normal mood and affect. Her behavior is normal. Judgment and thought content normal.  Nursing note and vitals reviewed.   Imaging: [place] Pelvis Limited  12/22/2014   CLINICAL DATA:  Right genital labial abscess.  EXAM: US PELVIS LIMITED  TECHNIQUE: Ultrasound examination of the pelvic soft tissues was performed in the area of clinical concern in the right labia.  COMPARISON:  None.  FINDINGS: Directed ultrasound examination in the area of clinical concern in the right labia majora shows a complex fluid collection which measures 5.0 x 2.3 by 3.0 cm. This is consistent with the given clinical history of abscess.  IMPRESSION: Complex fluid collection in area of right labia majora at site of clinical concern, which measures 5.0 x 2.3 x 3.0 cm.   Electronically Signed  By: Myles Rosenthal M.D.   On: 12/22/2014 11:17    Labs:  CBC:  Recent Labs  12/22/14 1025  WBC 7.4  HGB 10.9*  HCT 34.4*  PLT 209    COAGS:  Recent Labs  12/22/14 1300  INR 1.00  APTT 30    BMP:  Recent Labs  12/22/14 1025  NA 140  K 4.2  CL 106  CO2 27  GLUCOSE 102*  BUN 7  CALCIUM 8.9  CREATININE 0.55  GFRNONAA >90  GFRAA >90    LIVER FUNCTION TESTS:  Recent Labs  12/22/14 1025  BILITOT 0.3  AST 13  ALT 13  ALKPHOS 56  PROT 7.0    ALBUMIN 3.7    TUMOR MARKERS: No results for input(s): AFPTM, CEA, CA199, CHROMGRNA in the last 8760 hours.  Assessment and Plan:  Rt labial abscess Scheduled for abscess aspiration vs drain placement Pt aware of procedure benefits and risks and agreeable to proceed Consent signed and in chart  Thank you for this interesting consult.  I greatly enjoyed meeting Autumn Duffy and look forward to participating in their care.   I spent a total of 40 minutes face to face in clinical consultation, greater than 50% of which was counseling/coordinating care for Labial abscess aspiration/drain   Signed: Tymeer Vaquera A 12/22/2014, 3:36 PM

## 2014-12-22 NOTE — Sedation Documentation (Signed)
20cc infection aspirated from vulvular area

## 2014-12-22 NOTE — Sedation Documentation (Addendum)
[facility] here for procedure. Dr. Grace IsaacWatts talking with pt and family

## 2014-12-22 NOTE — Progress Notes (Signed)
[facility] with Heather at [facility]... Stated pt could have procedure done today if [facility] could transport now... [facility] notified, and enroute... Report given to [facility]... Will continue to monitor.

## 2014-12-22 NOTE — H&P (Signed)
See NP's note for full details.  Briefly this is a 39 yo non pregnant woman with a vulvar abscess that has recurred after drainage and antibiotics.  She does not have diabetes or another comorbid dz.  She is afebrile and has a WBC count of 7 but by SU there is still a 5x3x3 cm abscess with complex collection of fluid.   By exam she has a fluctuant mass under her R labia very tender extending up towards mons but not toward BGD.    Pt has already failed simple I&D- d/w Dr. Grace IsaacWatts of IR to have US guided drainage and drain placed.  Will start IV Ancef, admit pt and get coags for him.  If able, he will do today; if not, tomorrow.  Toshia Larkin A    NP's note: SUBJECTIVE HPI: Autumn Duffy is a 39 y.o. [id number] who presents to maternity admissions reporting painful right labial cyst persists after drainage in office and start of PO abx 2 days ago. She is taking Percocet as prescribed for the pain but she reports no improvement. She called her office this morning and was told to come to MAU for further evaluation.  Aggravating factors: Alleiviating factors:  Past Medical History  Diagnosis Date  . Thyroid disease    Past Surgical History  Procedure Laterality Date  . Cesarean section    . Retinal detachment surgery    . Wisdom tooth extraction     History   Social History  . Marital Status: Married    Spouse Name: N/A    Number of Children: N/A  . Years of Education: N/A   Occupational History  . Not on file.   Social History Main Topics  . Smoking status: Never Smoker   . Smokeless tobacco: Not on file  . Alcohol Use: No  . Drug Use: No  . Sexual Activity: Yes   Other Topics Concern  . Not on file   Social History Narrative   No current facility-administered medications on file prior to encounter.   Current Outpatient Prescriptions on File Prior to Encounter  Medication Sig  Dispense Refill  . levothyroxine (SYNTHROID, LEVOTHROID) 75 MCG tablet Take 75 mcg by mouth daily before breakfast.    . Multiple Vitamin (MULTIVITAMIN WITH MINERALS) TABS tablet Take 1 tablet by mouth daily.    Marland Kitchen. oxyCODONE-acetaminophen (PERCOCET/ROXICET) 5-325 MG per tablet Take 1-2 tablets by mouth every 4 (four) hours as needed. (Patient taking differently: Take 1-2 tablets by mouth every 4 (four) hours as needed for moderate pain. ) 30 tablet 0   Allergies  Allergen Reactions  . Penicillins Rash    Childhood reaction.    ROS: Pertinent items in HPI  OBJECTIVE Blood pressure 104/70, pulse 82, temperature 98.7 F (37.1 C), temperature source Oral, resp. rate 18, height 5\' 9"  (1.753 m), weight 72.576 kg (160 lb), last menstrual period 12/11/2014. GENERAL: Well-developed, well-nourished female in no acute distress.  HEENT: Normocephalic HEART: normal rate RESP: normal effort ABDOMEN: Soft, non-tender EXTREMITIES: Nontender, no edema NEURO: Alert and oriented SPECULUM EXAM: NEFG, physiologic discharge, no blood noted, cervix clean BIMANUAL:  uterus normal size, no adnexal tenderness or masses

## 2014-12-22 NOTE — Sedation Documentation (Signed)
Pt tolerated procedure well. [facility].

## 2014-12-22 NOTE — MAU Note (Signed)
On Sunday night thought she was getting a hemorrhoid, the next day she felt a nodule further up.  Came in on Tues, dx with Bartholin's, was drained in office on Wed- no longer having groin pain and that area is better.  But the original area at the back, (by the rectum) is really uncomfortable, "they have to remove it and take care of it, I just can't take it"

## 2014-12-22 NOTE — Sedation Documentation (Signed)
Will not apply bandaid to vulvular area- pressure held with gauze- no bleeding noted

## 2014-12-22 NOTE — Procedures (Signed)
Technically successful US guided aspiration of right labial abscess yielding 20 cc of bloody/purulent fluid.  No immediate complications.

## 2014-12-22 NOTE — MAU Provider Note (Signed)
Chief Complaint: Hemorrhoids   First Provider Initiated Contact with Patient 12/22/14 [phone]     SUBJECTIVE HPI: Autumn Duffy is a 39 y.o. G2P0011 who presents to maternity admissions reporting painful right labial cyst persists after drainage in office and start of PO abx 2 days ago.  She is taking Percocet as prescribed for the pain but she reports no improvement.  She called her office this morning and was told to come to MAU for further evaluation.  She denies fever/chills.    Past Medical History  Diagnosis Date  . Thyroid disease    Past Surgical History  Procedure Laterality Date  . Cesarean section    . Retinal detachment surgery    . Wisdom tooth extraction     History   Social History  . Marital Status: Married    Spouse Name: N/A    Number of Children: N/A  . Years of Education: N/A   Occupational History  . Not on file.   Social History Main Topics  . Smoking status: Never Smoker   . Smokeless tobacco: Not on file  . Alcohol Use: No  . Drug Use: No  . Sexual Activity: Yes   Other Topics Concern  . Not on file   Social History Narrative   No current facility-administered medications on file prior to encounter.   Current Outpatient Prescriptions on File Prior to Encounter  Medication Sig Dispense Refill  . levothyroxine (SYNTHROID, LEVOTHROID) 75 MCG tablet Take 75 mcg by mouth daily before breakfast.     Allergies  Allergen Reactions  . Penicillins Rash    Childhood reaction.    ROS: Pertinent items in HPI  OBJECTIVE Blood pressure 104/70, pulse 82, temperature 98.7 F (37.1 C), temperature source Oral, resp. rate 18, height 5\' 9"  (1.753 m), weight 72.576 kg (160 lb), last menstrual period 12/11/2014. GENERAL: Well-developed, well-nourished female in no acute distress.  HEENT: Normocephalic HEART: normal rate RESP: normal effort ABDOMEN: Soft, non-tender EXTREMITIES: Nontender, no edema NEURO: Alert and oriented On visual inspection, 3-4 cm  hard nodule on pt right labia, nonfluctuant, painful to touch.    LAB RESULTS Results for orders placed or performed during the hospital encounter of 12/22/14 (from the past 24 hour(s))  CBC     Status: Abnormal   Collection Time: 12/22/14 10:25 AM  Result Value Ref Range   WBC 7.4 4.0 - 10.5 K/uL   RBC 3.79 (L) 3.87 - 5.11 MIL/uL   Hemoglobin 10.9 (L) 12.0 - 15.0 g/dL   HCT 96.034.4 (L) 45.436.0 - 09.846.0 %   MCV 90.8 78.0 - 100.0 fL   MCH 28.8 26.0 - 34.0 pg   MCHC 31.7 30.0 - 36.0 g/dL   RDW 11.913.3 14.711.5 - 82.915.5 %   Platelets 209 150 - 400 K/uL  Comprehensive metabolic panel     Status: Abnormal   Collection Time: 12/22/14 10:25 AM  Result Value Ref Range   Sodium 140 135 - 145 mmol/L   Potassium 4.2 3.5 - 5.1 mmol/L   Chloride 106 96 - 112 mEq/L   CO2 27 19 - 32 mmol/L   Glucose, Bld 102 (H) 70 - 99 mg/dL   BUN 7 6 - 23 mg/dL   Creatinine, Ser 5.620.55 0.50 - 1.10 mg/dL   Calcium 8.9 8.4 - 13.010.5 mg/dL   Total Protein 7.0 6.0 - 8.3 g/dL   Albumin 3.7 3.5 - 5.2 g/dL   AST 13 0 - 37 U/L   ALT 13 0 - 35  U/L   Alkaline Phosphatase 56 39 - 117 U/L   Total Bilirubin 0.3 0.3 - 1.2 mg/dL   GFR calc non Af Amer >90 >90 mL/min   GFR calc Af Amer >90 >90 mL/min   Anion gap 7 5 - 15    IMAGING US Pelvis Limited  12/22/2014   CLINICAL DATA:  Right genital labial abscess.  EXAM: US PELVIS LIMITED  TECHNIQUE: Ultrasound examination of the pelvic soft tissues was performed in the area of clinical concern in the right labia.  COMPARISON:  None.  FINDINGS: Directed ultrasound examination in the area of clinical concern in the right labia majora shows a complex fluid collection which measures 5.0 x 2.3 by 3.0 cm. This is consistent with the given clinical history of abscess.  IMPRESSION: Complex fluid collection in area of right labia majora at site of clinical concern, which measures 5.0 x 2.3 x 3.0 cm.   Electronically Signed   By: Myles Rosenthal M.D.   On: 12/22/2014 11:17    ASSESSMENT 1. Left genital  labial abscess   2. Vulvar abscess     PLAN Consult Dr Henderson [facility] Admit for IV abx     Medication List    ASK your doctor about these medications        levothyroxine 75 MCG tablet  Commonly known as:  SYNTHROID, LEVOTHROID  Take 75 mcg by mouth daily before breakfast.         Sharen Counter Certified Nurse-Midwife 12/22/2014  12:59 PM

## 2014-12-23 MED ORDER — SODIUM CHLORIDE 0.9 % IV SOLN
INTRAVENOUS | Status: DC | PRN
  Administered 2014-12-23: 02:00:00 via INTRAVENOUS

## 2014-12-23 NOTE — Discharge Summary (Signed)
Physician Discharge Summary  Patient ID: Saundra ShellingJennifer D Gracey MRN: [id number] DOB/AGE: 41/26/1977 41 y.o.  Admit date: 12/22/2014 Discharge date: 12/23/2014  Admission Diagnoses:vulvar abscess  Discharge Diagnoses:  Active Problems:   Left genital labial abscess   Vulvar abscess   Discharged Condition: good  Hospital Course: Pt taken for US guided I&D by IR and tolerated well.  Drain not left in place.  Pt received >24 hours of IV antibiotics after and was d/ced to home without comp.  Consults: IR  Significant Diagnostic Studies: microbiology: wound culture: pending  Treatments: antibiotics: Ancef and I&D ultrasound guided  Discharge Exam: Blood pressure 105/59, pulse 78, temperature 98.2 F (36.8 C), temperature source Oral, resp. rate 16, height 5\' 9"  (1.753 m), weight 72.576 kg (160 lb), last menstrual period 12/11/2014, SpO2 100 %.   Disposition: 01-Home or Self Care     Medication List    ASK your doctor about these medications        levothyroxine 75 MCG tablet  Commonly known as:  SYNTHROID, LEVOTHROID  Take 75 mcg by mouth daily before breakfast.         Signed: Kenzly Rogoff A 12/23/2014, 7:36 AM

## 2014-12-23 NOTE — Progress Notes (Signed)
Patient is eating, ambulating, and voiding.  Pain control is good.  BP 105/59 mmHg  Pulse 78  Temp(Src) 98.2 F (36.8 C) (Oral)  Resp 16  Ht 5\' 9"  (1.753 m)  Wt 72.576 kg (160 lb)  BMI 23.62 kg/m2  SpO2 100%  LMP 12/11/2014 (Exact Date)  lungs:   clear to auscultation cor:    RRR Abdomen:  soft, appropriate tenderness, incisions intact and without erythema or exudate. ex:    no cords  Vulva:        Stable and without renewed swelling Lab Results  Component Value Date   WBC 7.4 12/22/2014   HGB 10.9* 12/22/2014   HCT 34.4* 12/22/2014   MCV 90.8 12/22/2014   PLT 209 12/22/2014    A/P  Routine care.  Expect d/c per plan.  Will continue IV antibiotics for at least 24 hours post I&D by IR- will d/c tomorrow.

## 2014-12-24 MED ORDER — OXYCODONE-ACETAMINOPHEN 5-325 MG PO TABS: 1.0000 | ORAL_TABLET | ORAL | Status: DC | PRN

## 2014-12-24 NOTE — Discharge Instructions (Signed)
Warm soapy h2o to clean with sport top water bottle.  If falls out, leave out.

## 2014-12-24 NOTE — Progress Notes (Signed)
Discharge teaching complete. Pt understood all instructions. All questions were answered. Pt pushed via wheelchair out of the hospital and discharged home to family.

## 2014-12-24 NOTE — Progress Notes (Addendum)
Patient is eating, ambulating, and voiding.  Pain control is good.  Pt feels like there is superficial accumulation again however.  BP 112/69 mmHg  Pulse 80  Temp(Src) 98.3 F (36.8 C) (Oral)  Resp 18  Ht 5\' 9"  (1.753 m)  Wt 72.576 kg (160 lb)  BMI 23.62 kg/m2  SpO2 100%  LMP 12/11/2014 (Exact Date)  lungs:   clear to auscultation cor:    RRR Abdomen:  soft, appropriate tenderness, incisions intact and without erythema or exudate. ex:    no cords   Vulva- right side with some fluctuant superficial.  This I&Ded again with stab incision and word cath attempted to be placed but would not stay in.  Minimal pus drained.     Lab Results  Component Value Date   WBC 7.4 12/22/2014   HGB 10.9* 12/22/2014   HCT 34.4* 12/22/2014   MCV 90.8 12/22/2014   PLT 209 12/22/2014    A/P  Routine care.  Expect d/c per plan.  No further antibiotics are needed.  Wound culture pending- a few gram negative rods were seen on gram stain.  Wash area 3 times a day- RTC 1 week for removal.

## 2014-12-25 LAB — CULTURE, ROUTINE-ABSCESS: SPECIAL REQUESTS: NORMAL

## 2015-11-06 ENCOUNTER — Other Ambulatory Visit: Payer: Self-pay | Admitting: Obstetrics and Gynecology

## 2015-11-07 LAB — CYTOLOGY - PAP

## 2016-03-14 DIAGNOSIS — M9902 Segmental and somatic dysfunction of thoracic region: Secondary | ICD-10-CM | POA: Diagnosis not present

## 2016-03-14 DIAGNOSIS — M542 Cervicalgia: Secondary | ICD-10-CM | POA: Diagnosis not present

## 2016-03-14 DIAGNOSIS — M546 Pain in thoracic spine: Secondary | ICD-10-CM | POA: Diagnosis not present

## 2016-03-14 DIAGNOSIS — M9901 Segmental and somatic dysfunction of cervical region: Secondary | ICD-10-CM | POA: Diagnosis not present

## 2016-03-20 DIAGNOSIS — K5900 Constipation, unspecified: Secondary | ICD-10-CM | POA: Diagnosis not present

## 2016-03-20 DIAGNOSIS — Z1389 Encounter for screening for other disorder: Secondary | ICD-10-CM | POA: Diagnosis not present

## 2016-03-20 DIAGNOSIS — Z6824 Body mass index (BMI) 24.0-24.9, adult: Secondary | ICD-10-CM | POA: Diagnosis not present

## 2016-08-16 ENCOUNTER — Inpatient Hospital Stay (HOSPITAL_COMMUNITY)
Admission: AD | Admit: 2016-08-16 | Discharge: 2016-08-16 | Disposition: A | Discharge status: Home / Self Care | Payer: 59 | Source: Ambulatory Visit | Attending: Obstetrics and Gynecology | Admitting: Obstetrics and Gynecology

## 2016-08-16 ENCOUNTER — Encounter (HOSPITAL_COMMUNITY): Payer: Self-pay

## 2016-08-16 DIAGNOSIS — L720 Epidermal cyst: Secondary | ICD-10-CM

## 2016-08-16 DIAGNOSIS — R102 Pelvic and perineal pain: Secondary | ICD-10-CM

## 2016-08-16 DIAGNOSIS — E079 Disorder of thyroid, unspecified: Secondary | ICD-10-CM | POA: Diagnosis not present

## 2016-08-16 DIAGNOSIS — N764 Abscess of vulva: Secondary | ICD-10-CM | POA: Diagnosis not present

## 2016-08-16 DIAGNOSIS — IMO0002 Reserved for concepts with insufficient information to code with codable children: Secondary | ICD-10-CM

## 2016-08-16 DIAGNOSIS — Z3202 Encounter for pregnancy test, result negative: Secondary | ICD-10-CM | POA: Insufficient documentation

## 2016-08-16 DIAGNOSIS — R1909 Other intra-abdominal and pelvic swelling, mass and lump: Secondary | ICD-10-CM | POA: Diagnosis not present

## 2016-08-16 LAB — URINE MICROSCOPIC-ADD ON

## 2016-08-16 LAB — URINALYSIS, ROUTINE W REFLEX MICROSCOPIC
Bilirubin Urine: NEGATIVE
GLUCOSE, UA: NEGATIVE mg/dL
HGB URINE DIPSTICK: NEGATIVE
Ketones, ur: NEGATIVE mg/dL
Nitrite: NEGATIVE
PH: 6.5 (ref 5.0–8.0)
Protein, ur: NEGATIVE mg/dL

## 2016-08-16 LAB — POCT PREGNANCY, URINE: Preg Test, Ur: NEGATIVE

## 2016-08-16 MED ORDER — OXYCODONE-ACETAMINOPHEN 5-325 MG PO TABS: 2.0000 | ORAL_TABLET | Freq: Four times a day (QID) | ORAL | 0 refills | Status: DC | PRN

## 2016-08-16 MED ORDER — OXYCODONE-ACETAMINOPHEN 5-325 MG PO TABS
2.0000 | ORAL_TABLET | Freq: Once | ORAL | Status: AC
  Administered 2016-08-16: 2 via ORAL
  Filled 2016-08-16: qty 2

## 2016-08-16 NOTE — MAU Provider Note (Signed)
Chief Complaint: pain on right side of perineum   First Provider Initiated Contact with Patient 08/16/16 1326      SUBJECTIVE HPI: Autumn Duffy is a 40 y.o. G2P0011 who presents to maternity admissions reporting painful swelling on right side of labia.  She reports pain and swelling started yesterday, worsened today.  She has taken ibuprofen but it has not helped.  She had a similar occurrence last year and had a boil drained under ultrasound guidance in the OR because it did not fully drain in the office with Dr Dareen Piano.    She called Dr Dareen Piano and he prescribed abx but she came to MAU when the pain was so severe that it was associated with nausea and near syncope.  She is worried that this one will become as severe as last time. She denies vaginal bleeding, vaginal itching/burning, urinary symptoms, h/a, dizziness, n/v, or fever/chills.     HPI  Past Medical History:  Diagnosis Date  . Thyroid disease    Past Surgical History:  Procedure Laterality Date  . CESAREAN SECTION    . RETINAL DETACHMENT SURGERY    . WISDOM TOOTH EXTRACTION     Social History   Social History  . Marital status: Married    Spouse name: N/A  . Number of children: N/A  . Years of education: N/A   Occupational History  . Not on file.   Social History Main Topics  . Smoking status: Never Smoker  . Smokeless tobacco: Never Used  . Alcohol use No  . Drug use: No  . Sexual activity: Yes    Birth control/ protection: None   Other Topics Concern  . Not on file   Social History Narrative  . No narrative on file   No current facility-administered medications on file prior to encounter.    Current Outpatient Prescriptions on File Prior to Encounter  Medication Sig Dispense Refill  . levothyroxine (SYNTHROID, LEVOTHROID) 75 MCG tablet Take 75 mcg by mouth daily before breakfast.     No Known Allergies  ROS:  Review of Systems  Constitutional: Negative for chills, fatigue and fever.   Respiratory: Negative for shortness of breath.   Cardiovascular: Negative for chest pain.  Gastrointestinal: Positive for nausea. Negative for vomiting.  Genitourinary: Positive for vaginal pain. Negative for difficulty urinating, dysuria, flank pain, pelvic pain, vaginal bleeding and vaginal discharge.  Neurological: Negative for dizziness and headaches.  Psychiatric/Behavioral: Negative.      I have reviewed patient's Past Medical Hx, Surgical Hx, Family Hx, Social Hx, medications and allergies.   Physical Exam   Patient Vitals for the past 24 hrs:  BP Pulse Resp  08/16/16 1208 102/58 97 16   Constitutional: Well-developed, well-nourished female in no acute distress.  Cardiovascular: normal rate Respiratory: normal effort GI: Abd soft, non-tender. Pos BS x 4 MS: Extremities nontender, no edema, normal ROM Neurologic: Alert and oriented x 4.  GU: Neg CVAT.  PELVIC EXAM: On visual inspection, generalized inflammation of right groin area immediately outside of right labia that is soft and painful to touch.  LAB RESULTS Results for orders placed or performed during the hospital encounter of 08/16/16 (from the past 24 hour(s))  Urinalysis, Routine w reflex microscopic (not at [facility])     Status: Abnormal   Collection Time: 08/16/16 11:48 AM  Result Value Ref Range   Color, Urine YELLOW YELLOW   APPearance CLEAR CLEAR   Specific Gravity, Urine <1.005 (L) 1.005 - 1.030   pH  6.5 5.0 - 8.0   Glucose, UA NEGATIVE NEGATIVE mg/dL   Hgb urine dipstick NEGATIVE NEGATIVE   Bilirubin Urine NEGATIVE NEGATIVE   Ketones, ur NEGATIVE NEGATIVE mg/dL   Protein, ur NEGATIVE NEGATIVE mg/dL   Nitrite NEGATIVE NEGATIVE   Leukocytes, UA SMALL (A) NEGATIVE  Urine microscopic-add on     Status: Abnormal   Collection Time: 08/16/16 11:48 AM  Result Value Ref Range   Squamous Epithelial / LPF 0-5 (A) NONE SEEN   WBC, UA 0-5 0 - 5 WBC/hpf   RBC / HPF 0-5 0 - 5 RBC/hpf   Bacteria, UA FEW (A) NONE  SEEN  Pregnancy, urine POC     Status: None   Collection Time: 08/16/16 12:01 PM  Result Value Ref Range   Preg Test, Ur NEGATIVE NEGATIVE       IMAGING No results found.  MAU Management/MDM: Ordered labs and reviewed results.  Likely folliculitis leading to groin/labial abscess.  Area of infection/abscess is deep in right groin area, so not possible to I&D today in MAU. Consult Dr Dareen PianoAnderson.  Will treat pain here in MAU and with Rx for home.  Pt to start abx as prescribed and f/u in office on Monday.  Warm compresses at least BID.  Treatments in MAU included Percocet 5/325 x 2 tabs with significant improvement in pain. Pt stable at time of discharge.  ASSESSMENT 1. Groin cyst   2. Vaginal pain   3. Labial abscess     PLAN Discharge home   Medication List    TAKE these medications   levothyroxine 75 MCG tablet Commonly known as:  SYNTHROID, LEVOTHROID Take 75 mcg by mouth daily before breakfast.   multivitamin with minerals Tabs tablet Take 1 tablet by mouth daily.   oxyCODONE-acetaminophen 5-325 MG tablet Commonly known as:  PERCOCET/ROXICET Take 2 tablets by mouth every 6 (six) hours as needed for severe pain.      Follow-up Information    Levi AlandANDERSON,MARK E, MD. Call today.   Specialty:  Obstetrics and Gynecology Why:  On Monday, return to MAU as needed for emergencies Contact information: [address] [place] [phone]           Sharen CounterLisa Leftwich-Kirby Certified Nurse-Midwife 08/16/2016  2:00 PM

## 2016-08-16 NOTE — Discharge Instructions (Signed)

## 2016-08-16 NOTE — MAU Note (Signed)
Pt stated pain onset was around 9:30 this morning when she sat down.  Felt like she was going to throw up and pass out. Pt stated the spell lasted about 2-3 minutes.

## 2016-08-19 DIAGNOSIS — Z6825 Body mass index (BMI) 25.0-25.9, adult: Secondary | ICD-10-CM | POA: Diagnosis not present

## 2016-08-19 DIAGNOSIS — N764 Abscess of vulva: Secondary | ICD-10-CM | POA: Diagnosis not present

## 2016-09-23 DIAGNOSIS — L738 Other specified follicular disorders: Secondary | ICD-10-CM | POA: Diagnosis not present

## 2016-09-23 DIAGNOSIS — D171 Benign lipomatous neoplasm of skin and subcutaneous tissue of trunk: Secondary | ICD-10-CM | POA: Diagnosis not present

## 2016-09-23 DIAGNOSIS — L812 Freckles: Secondary | ICD-10-CM | POA: Diagnosis not present

## 2016-09-23 DIAGNOSIS — D224 Melanocytic nevi of scalp and neck: Secondary | ICD-10-CM | POA: Diagnosis not present

## 2016-10-17 DIAGNOSIS — E663 Overweight: Secondary | ICD-10-CM | POA: Diagnosis not present

## 2016-10-17 DIAGNOSIS — Z0000 Encounter for general adult medical examination without abnormal findings: Secondary | ICD-10-CM | POA: Diagnosis not present

## 2016-10-17 DIAGNOSIS — E063 Autoimmune thyroiditis: Secondary | ICD-10-CM | POA: Diagnosis not present

## 2016-10-17 DIAGNOSIS — Z6825 Body mass index (BMI) 25.0-25.9, adult: Secondary | ICD-10-CM | POA: Diagnosis not present

## 2016-10-17 DIAGNOSIS — Z1389 Encounter for screening for other disorder: Secondary | ICD-10-CM | POA: Diagnosis not present

## 2016-11-11 ENCOUNTER — Other Ambulatory Visit: Payer: Self-pay | Admitting: Obstetrics and Gynecology

## 2016-11-11 DIAGNOSIS — Z124 Encounter for screening for malignant neoplasm of cervix: Secondary | ICD-10-CM | POA: Diagnosis not present

## 2016-11-11 DIAGNOSIS — Z01419 Encounter for gynecological examination (general) (routine) without abnormal findings: Secondary | ICD-10-CM | POA: Diagnosis not present

## 2016-11-11 DIAGNOSIS — Z6825 Body mass index (BMI) 25.0-25.9, adult: Secondary | ICD-10-CM | POA: Diagnosis not present

## 2016-11-11 DIAGNOSIS — Z1231 Encounter for screening mammogram for malignant neoplasm of breast: Secondary | ICD-10-CM | POA: Diagnosis not present

## 2016-11-12 ENCOUNTER — Other Ambulatory Visit: Payer: Self-pay | Admitting: Obstetrics and Gynecology

## 2016-11-12 DIAGNOSIS — R928 Other abnormal and inconclusive findings on diagnostic imaging of breast: Secondary | ICD-10-CM

## 2016-11-12 LAB — CYTOLOGY - PAP

## 2016-11-14 ENCOUNTER — Ambulatory Visit
Admission: RE | Admit: 2016-11-14 | Discharge: 2016-11-14 | Disposition: A | Discharge status: Home / Self Care | Payer: BLUE CROSS/BLUE SHIELD | Source: Ambulatory Visit | Attending: Obstetrics and Gynecology | Admitting: Obstetrics and Gynecology

## 2016-11-14 DIAGNOSIS — R928 Other abnormal and inconclusive findings on diagnostic imaging of breast: Secondary | ICD-10-CM

## 2016-11-19 ENCOUNTER — Other Ambulatory Visit: Payer: 59

## 2017-02-11 DIAGNOSIS — Z111 Encounter for screening for respiratory tuberculosis: Secondary | ICD-10-CM | POA: Diagnosis not present

## 2017-10-23 DIAGNOSIS — Z23 Encounter for immunization: Secondary | ICD-10-CM | POA: Diagnosis not present

## 2017-10-23 DIAGNOSIS — Z1389 Encounter for screening for other disorder: Secondary | ICD-10-CM | POA: Diagnosis not present

## 2017-10-23 DIAGNOSIS — Z6826 Body mass index (BMI) 26.0-26.9, adult: Secondary | ICD-10-CM | POA: Diagnosis not present

## 2017-10-23 DIAGNOSIS — Z0001 Encounter for general adult medical examination with abnormal findings: Secondary | ICD-10-CM | POA: Diagnosis not present

## 2017-10-23 DIAGNOSIS — E063 Autoimmune thyroiditis: Secondary | ICD-10-CM | POA: Diagnosis not present

## 2017-10-23 DIAGNOSIS — E663 Overweight: Secondary | ICD-10-CM | POA: Diagnosis not present

## 2017-10-26 DIAGNOSIS — L821 Other seborrheic keratosis: Secondary | ICD-10-CM | POA: Diagnosis not present

## 2017-10-26 DIAGNOSIS — D2272 Melanocytic nevi of left lower limb, including hip: Secondary | ICD-10-CM | POA: Diagnosis not present

## 2017-10-26 DIAGNOSIS — D225 Melanocytic nevi of trunk: Secondary | ICD-10-CM | POA: Diagnosis not present

## 2017-10-26 DIAGNOSIS — D485 Neoplasm of uncertain behavior of skin: Secondary | ICD-10-CM | POA: Diagnosis not present

## 2017-10-26 DIAGNOSIS — D2271 Melanocytic nevi of right lower limb, including hip: Secondary | ICD-10-CM | POA: Diagnosis not present

## 2017-11-23 DIAGNOSIS — Z124 Encounter for screening for malignant neoplasm of cervix: Secondary | ICD-10-CM | POA: Diagnosis not present

## 2017-11-23 DIAGNOSIS — Z1231 Encounter for screening mammogram for malignant neoplasm of breast: Secondary | ICD-10-CM | POA: Diagnosis not present

## 2017-11-23 DIAGNOSIS — Z6825 Body mass index (BMI) 25.0-25.9, adult: Secondary | ICD-10-CM | POA: Diagnosis not present

## 2017-11-23 DIAGNOSIS — Z01419 Encounter for gynecological examination (general) (routine) without abnormal findings: Secondary | ICD-10-CM | POA: Diagnosis not present

## 2017-11-27 ENCOUNTER — Other Ambulatory Visit: Payer: Self-pay | Admitting: Obstetrics and Gynecology

## 2017-11-27 DIAGNOSIS — R928 Other abnormal and inconclusive findings on diagnostic imaging of breast: Secondary | ICD-10-CM

## 2017-12-04 ENCOUNTER — Ambulatory Visit: Payer: BLUE CROSS/BLUE SHIELD

## 2017-12-04 ENCOUNTER — Ambulatory Visit
Admission: RE | Admit: 2017-12-04 | Discharge: 2017-12-04 | Disposition: A | Discharge status: Home / Self Care | Payer: BLUE CROSS/BLUE SHIELD | Source: Ambulatory Visit | Attending: Obstetrics and Gynecology | Admitting: Obstetrics and Gynecology

## 2017-12-04 DIAGNOSIS — R928 Other abnormal and inconclusive findings on diagnostic imaging of breast: Secondary | ICD-10-CM

## 2018-02-25 DIAGNOSIS — J069 Acute upper respiratory infection, unspecified: Secondary | ICD-10-CM | POA: Diagnosis not present

## 2018-04-19 DIAGNOSIS — E663 Overweight: Secondary | ICD-10-CM | POA: Diagnosis not present

## 2018-04-19 DIAGNOSIS — Z1389 Encounter for screening for other disorder: Secondary | ICD-10-CM | POA: Diagnosis not present

## 2018-04-19 DIAGNOSIS — Z0000 Encounter for general adult medical examination without abnormal findings: Secondary | ICD-10-CM | POA: Diagnosis not present

## 2018-04-19 DIAGNOSIS — Z6826 Body mass index (BMI) 26.0-26.9, adult: Secondary | ICD-10-CM | POA: Diagnosis not present

## 2018-04-19 DIAGNOSIS — E063 Autoimmune thyroiditis: Secondary | ICD-10-CM | POA: Diagnosis not present

## 2018-08-24 DIAGNOSIS — Z23 Encounter for immunization: Secondary | ICD-10-CM | POA: Diagnosis not present

## 2018-10-01 DIAGNOSIS — J069 Acute upper respiratory infection, unspecified: Secondary | ICD-10-CM | POA: Diagnosis not present

## 2018-10-01 DIAGNOSIS — J209 Acute bronchitis, unspecified: Secondary | ICD-10-CM | POA: Diagnosis not present

## 2018-10-01 DIAGNOSIS — Z1389 Encounter for screening for other disorder: Secondary | ICD-10-CM | POA: Diagnosis not present

## 2018-10-01 DIAGNOSIS — E663 Overweight: Secondary | ICD-10-CM | POA: Diagnosis not present

## 2018-10-01 DIAGNOSIS — Z6826 Body mass index (BMI) 26.0-26.9, adult: Secondary | ICD-10-CM | POA: Diagnosis not present

## 2018-10-25 DIAGNOSIS — L239 Allergic contact dermatitis, unspecified cause: Secondary | ICD-10-CM | POA: Diagnosis not present

## 2018-10-25 DIAGNOSIS — Z6825 Body mass index (BMI) 25.0-25.9, adult: Secondary | ICD-10-CM | POA: Diagnosis not present

## 2018-10-25 DIAGNOSIS — E063 Autoimmune thyroiditis: Secondary | ICD-10-CM | POA: Diagnosis not present

## 2018-10-25 DIAGNOSIS — Z1389 Encounter for screening for other disorder: Secondary | ICD-10-CM | POA: Diagnosis not present

## 2018-10-25 DIAGNOSIS — E663 Overweight: Secondary | ICD-10-CM | POA: Diagnosis not present

## 2018-10-25 DIAGNOSIS — Z0000 Encounter for general adult medical examination without abnormal findings: Secondary | ICD-10-CM | POA: Diagnosis not present

## 2018-11-25 DIAGNOSIS — Z124 Encounter for screening for malignant neoplasm of cervix: Secondary | ICD-10-CM | POA: Diagnosis not present

## 2018-11-25 DIAGNOSIS — Z6825 Body mass index (BMI) 25.0-25.9, adult: Secondary | ICD-10-CM | POA: Diagnosis not present

## 2018-11-25 DIAGNOSIS — Z01419 Encounter for gynecological examination (general) (routine) without abnormal findings: Secondary | ICD-10-CM | POA: Diagnosis not present

## 2018-11-25 DIAGNOSIS — Z1231 Encounter for screening mammogram for malignant neoplasm of breast: Secondary | ICD-10-CM | POA: Diagnosis not present

## 2019-01-07 DIAGNOSIS — D225 Melanocytic nevi of trunk: Secondary | ICD-10-CM | POA: Diagnosis not present

## 2019-01-07 DIAGNOSIS — D2272 Melanocytic nevi of left lower limb, including hip: Secondary | ICD-10-CM | POA: Diagnosis not present

## 2019-01-07 DIAGNOSIS — D2271 Melanocytic nevi of right lower limb, including hip: Secondary | ICD-10-CM | POA: Diagnosis not present

## 2019-01-07 DIAGNOSIS — L821 Other seborrheic keratosis: Secondary | ICD-10-CM | POA: Diagnosis not present

## 2019-01-25 DIAGNOSIS — J398 Other specified diseases of upper respiratory tract: Secondary | ICD-10-CM | POA: Diagnosis not present

## 2019-02-21 DIAGNOSIS — Z111 Encounter for screening for respiratory tuberculosis: Secondary | ICD-10-CM | POA: Diagnosis not present

## 2019-05-24 DIAGNOSIS — N926 Irregular menstruation, unspecified: Secondary | ICD-10-CM | POA: Diagnosis not present

## 2019-11-02 DIAGNOSIS — Z1389 Encounter for screening for other disorder: Secondary | ICD-10-CM | POA: Diagnosis not present

## 2019-11-02 DIAGNOSIS — E559 Vitamin D deficiency, unspecified: Secondary | ICD-10-CM | POA: Diagnosis not present

## 2019-11-02 DIAGNOSIS — Z6824 Body mass index (BMI) 24.0-24.9, adult: Secondary | ICD-10-CM | POA: Diagnosis not present

## 2019-11-02 DIAGNOSIS — Z0000 Encounter for general adult medical examination without abnormal findings: Secondary | ICD-10-CM | POA: Diagnosis not present

## 2019-11-02 DIAGNOSIS — E063 Autoimmune thyroiditis: Secondary | ICD-10-CM | POA: Diagnosis not present

## 2019-11-02 DIAGNOSIS — E039 Hypothyroidism, unspecified: Secondary | ICD-10-CM | POA: Diagnosis not present

## 2019-12-26 DIAGNOSIS — Z6824 Body mass index (BMI) 24.0-24.9, adult: Secondary | ICD-10-CM | POA: Diagnosis not present

## 2019-12-26 DIAGNOSIS — E039 Hypothyroidism, unspecified: Secondary | ICD-10-CM | POA: Diagnosis not present

## 2019-12-26 DIAGNOSIS — Z1231 Encounter for screening mammogram for malignant neoplasm of breast: Secondary | ICD-10-CM | POA: Diagnosis not present

## 2019-12-26 DIAGNOSIS — Z01419 Encounter for gynecological examination (general) (routine) without abnormal findings: Secondary | ICD-10-CM | POA: Diagnosis not present

## 2019-12-26 DIAGNOSIS — Z124 Encounter for screening for malignant neoplasm of cervix: Secondary | ICD-10-CM | POA: Diagnosis not present

## 2020-02-17 DIAGNOSIS — Z6825 Body mass index (BMI) 25.0-25.9, adult: Secondary | ICD-10-CM | POA: Diagnosis not present

## 2020-02-17 DIAGNOSIS — N751 Abscess of Bartholin's gland: Secondary | ICD-10-CM | POA: Diagnosis not present

## 2020-02-22 DIAGNOSIS — Z6825 Body mass index (BMI) 25.0-25.9, adult: Secondary | ICD-10-CM | POA: Diagnosis not present

## 2020-02-22 DIAGNOSIS — N751 Abscess of Bartholin's gland: Secondary | ICD-10-CM | POA: Diagnosis not present

## 2020-02-23 ENCOUNTER — Encounter (HOSPITAL_BASED_OUTPATIENT_CLINIC_OR_DEPARTMENT_OTHER): Payer: Self-pay | Admitting: Obstetrics and Gynecology

## 2020-02-23 ENCOUNTER — Other Ambulatory Visit: Payer: Self-pay | Admitting: Obstetrics and Gynecology

## 2020-02-23 ENCOUNTER — Other Ambulatory Visit (HOSPITAL_COMMUNITY)
Admission: RE | Admit: 2020-02-23 | Discharge: 2020-02-23 | Disposition: A | Discharge status: Home / Self Care | Payer: BC Managed Care – PPO | Source: Ambulatory Visit | Attending: Obstetrics and Gynecology | Admitting: Obstetrics and Gynecology

## 2020-02-23 ENCOUNTER — Other Ambulatory Visit: Payer: Self-pay

## 2020-02-23 DIAGNOSIS — Z20822 Contact with and (suspected) exposure to covid-19: Secondary | ICD-10-CM | POA: Insufficient documentation

## 2020-02-23 DIAGNOSIS — Z01812 Encounter for preprocedural laboratory examination: Secondary | ICD-10-CM | POA: Insufficient documentation

## 2020-02-23 LAB — SARS CORONAVIRUS 2 (TAT 6-24 HRS): SARS Coronavirus 2: NEGATIVE

## 2020-02-23 NOTE — Progress Notes (Addendum)
Spoke w/ via phone for pre-op interview--- PT Lab needs dos---- pre-op orders need second sign (Urine preg., T&S)              Lab results------ no COVID test ------ 02-23-2020 @ 1500 Arrive at ------- 0530 NPO after ------ MN Medications to take morning of surgery ----- Synthroid w/ sips of water Diabetic medication ----- n/a Patient Special Instructions ----- n/a Pre-Op special Istructions ----- n/a Patient verbalized understanding of instructions that were given at this phone interview. Patient denies shortness of breath, chest pain, fever, cough a this phone interview.

## 2020-02-23 NOTE — Anesthesia Preprocedure Evaluation (Addendum)
Anesthesia Evaluation  Patient identified by MRN, date of birth, ID band Patient awake    Reviewed: Allergy & Precautions, NPO status , Patient's Chart, lab work & pertinent test results  History of Anesthesia Complications Negative for: history of anesthetic complications  Airway Mallampati: I  TM Distance: >3 FB Neck ROM: Full    Dental no notable dental hx.    Pulmonary neg pulmonary ROS,    Pulmonary exam normal        Cardiovascular negative cardio ROS Normal cardiovascular exam     Neuro/Psych negative neurological ROS  negative psych ROS   GI/Hepatic negative GI ROS, Neg liver ROS,   Endo/Other  Hypothyroidism   Renal/GU negative Renal ROS   Bartholin's gland abscess    Musculoskeletal negative musculoskeletal ROS (+)   Abdominal   Peds  Hematology negative hematology ROS (+)   Anesthesia Other Findings Day of surgery medications reviewed with patient.  Reproductive/Obstetrics negative OB ROS                            Anesthesia Physical Anesthesia Plan  ASA: II  Anesthesia Plan: General   Post-op Pain Management:    Induction: Intravenous  PONV Risk Score and Plan: 4 or greater and Treatment may vary due to age or medical condition, Ondansetron, Dexamethasone, Midazolam and Scopolamine patch - Pre-op  Airway Management Planned: LMA  Additional Equipment: None  Intra-op Plan:   Post-operative Plan: Extubation in OR  Informed Consent: I have reviewed the patients History and Physical, chart, labs and discussed the procedure including the risks, benefits and alternatives for the proposed anesthesia with the patient or authorized representative who has indicated his/her understanding and acceptance.     Dental advisory given  Plan Discussed with: CRNA  Anesthesia Plan Comments:        Anesthesia Quick Evaluation

## 2020-02-24 ENCOUNTER — Encounter (HOSPITAL_BASED_OUTPATIENT_CLINIC_OR_DEPARTMENT_OTHER): Payer: Self-pay | Admitting: Obstetrics and Gynecology

## 2020-02-24 ENCOUNTER — Other Ambulatory Visit: Payer: Self-pay

## 2020-02-24 ENCOUNTER — Ambulatory Visit (HOSPITAL_BASED_OUTPATIENT_CLINIC_OR_DEPARTMENT_OTHER): Payer: BC Managed Care – PPO | Admitting: Anesthesiology

## 2020-02-24 ENCOUNTER — Ambulatory Visit (HOSPITAL_BASED_OUTPATIENT_CLINIC_OR_DEPARTMENT_OTHER)
Admission: RE | Admit: 2020-02-24 | Discharge: 2020-02-24 | Disposition: A | Discharge status: Home / Self Care | Payer: BC Managed Care – PPO | Attending: Obstetrics and Gynecology | Admitting: Obstetrics and Gynecology

## 2020-02-24 ENCOUNTER — Encounter (HOSPITAL_BASED_OUTPATIENT_CLINIC_OR_DEPARTMENT_OTHER)
Admission: RE | Disposition: A | Discharge status: Home / Self Care | Payer: Self-pay | Source: Home / Self Care | Attending: Obstetrics and Gynecology

## 2020-02-24 DIAGNOSIS — Z79890 Hormone replacement therapy (postmenopausal): Secondary | ICD-10-CM | POA: Diagnosis not present

## 2020-02-24 DIAGNOSIS — N751 Abscess of Bartholin's gland: Secondary | ICD-10-CM | POA: Diagnosis not present

## 2020-02-24 DIAGNOSIS — E039 Hypothyroidism, unspecified: Secondary | ICD-10-CM | POA: Diagnosis not present

## 2020-02-24 HISTORY — DX: Abscess of Bartholin's gland: N75.1

## 2020-02-24 HISTORY — DX: Presence of spectacles and contact lenses: Z97.3

## 2020-02-24 HISTORY — DX: Hypothyroidism, unspecified: E03.9

## 2020-02-24 HISTORY — PX: BARTHOLIN CYST MARSUPIALIZATION: SHX5383

## 2020-02-24 LAB — POCT PREGNANCY, URINE: Preg Test, Ur: NEGATIVE

## 2020-02-24 SURGERY — MARSUPIALIZATION, CYST, BARTHOLIN'S GLAND
Anesthesia: General | Site: Vagina | Laterality: Left

## 2020-02-24 MED ORDER — MIDAZOLAM HCL 2 MG/2ML IJ SOLN
INTRAMUSCULAR | Status: AC
  Filled 2020-02-24: qty 2

## 2020-02-24 MED ORDER — ACETAMINOPHEN 500 MG PO TABS
1000.0000 mg | ORAL_TABLET | Freq: Once | ORAL | Status: AC
  Administered 2020-02-24: 1000 mg via ORAL
  Filled 2020-02-24: qty 2

## 2020-02-24 MED ORDER — LACTATED RINGERS IV SOLN
INTRAVENOUS | Status: DC
  Filled 2020-02-24: qty 1000

## 2020-02-24 MED ORDER — SCOPOLAMINE 1 MG/3DAYS TD PT72
MEDICATED_PATCH | TRANSDERMAL | Status: AC
  Filled 2020-02-24: qty 1

## 2020-02-24 MED ORDER — LIDOCAINE 2% (20 MG/ML) 5 ML SYRINGE
INTRAMUSCULAR | Status: DC | PRN
  Administered 2020-02-24: 80 mg via INTRAVENOUS

## 2020-02-24 MED ORDER — FENTANYL CITRATE (PF) 100 MCG/2ML IJ SOLN
25.0000 ug | INTRAMUSCULAR | Status: DC | PRN
  Filled 2020-02-24: qty 1

## 2020-02-24 MED ORDER — LIDOCAINE 2% (20 MG/ML) 5 ML SYRINGE
INTRAMUSCULAR | Status: AC
  Filled 2020-02-24: qty 5

## 2020-02-24 MED ORDER — FENTANYL CITRATE (PF) 100 MCG/2ML IJ SOLN
INTRAMUSCULAR | Status: AC
  Filled 2020-02-24: qty 2

## 2020-02-24 MED ORDER — SCOPOLAMINE 1 MG/3DAYS TD PT72
1.0000 | MEDICATED_PATCH | Freq: Once | TRANSDERMAL | Status: DC
  Administered 2020-02-24: 1.5 mg via TRANSDERMAL
  Filled 2020-02-24: qty 1

## 2020-02-24 MED ORDER — ARTIFICIAL TEARS OPHTHALMIC OINT
TOPICAL_OINTMENT | OPHTHALMIC | Status: AC
  Filled 2020-02-24: qty 3.5

## 2020-02-24 MED ORDER — OXYCODONE HCL 5 MG/5ML PO SOLN
5.0000 mg | Freq: Once | ORAL | Status: DC | PRN
  Filled 2020-02-24: qty 5

## 2020-02-24 MED ORDER — PROPOFOL 10 MG/ML IV BOLUS
INTRAVENOUS | Status: AC
  Filled 2020-02-24: qty 40

## 2020-02-24 MED ORDER — ONDANSETRON HCL 4 MG/2ML IJ SOLN
INTRAMUSCULAR | Status: DC | PRN
  Administered 2020-02-24: 4 mg via INTRAVENOUS

## 2020-02-24 MED ORDER — ONDANSETRON HCL 4 MG/2ML IJ SOLN
INTRAMUSCULAR | Status: AC
  Filled 2020-02-24: qty 2

## 2020-02-24 MED ORDER — DEXAMETHASONE SODIUM PHOSPHATE 10 MG/ML IJ SOLN
INTRAMUSCULAR | Status: AC
  Filled 2020-02-24: qty 1

## 2020-02-24 MED ORDER — OXYCODONE HCL 5 MG PO TABS
5.0000 mg | ORAL_TABLET | Freq: Once | ORAL | Status: DC | PRN
  Filled 2020-02-24: qty 1

## 2020-02-24 MED ORDER — ACETAMINOPHEN 500 MG PO TABS
ORAL_TABLET | ORAL | Status: AC
  Filled 2020-02-24: qty 2

## 2020-02-24 MED ORDER — PROMETHAZINE HCL 25 MG/ML IJ SOLN
6.2500 mg | INTRAMUSCULAR | Status: DC | PRN
  Filled 2020-02-24: qty 1

## 2020-02-24 MED ORDER — PROPOFOL 10 MG/ML IV BOLUS
INTRAVENOUS | Status: DC | PRN
  Administered 2020-02-24: 140 mg via INTRAVENOUS

## 2020-02-24 MED ORDER — MIDAZOLAM HCL 2 MG/2ML IJ SOLN
INTRAMUSCULAR | Status: DC | PRN
  Administered 2020-02-24: 2 mg via INTRAVENOUS

## 2020-02-24 MED ORDER — FENTANYL CITRATE (PF) 100 MCG/2ML IJ SOLN
INTRAMUSCULAR | Status: DC | PRN
  Administered 2020-02-24 (×2): 50 ug via INTRAVENOUS

## 2020-02-24 MED ORDER — DEXAMETHASONE SODIUM PHOSPHATE 10 MG/ML IJ SOLN
INTRAMUSCULAR | Status: DC | PRN
  Administered 2020-02-24: 4 mg via INTRAVENOUS

## 2020-02-24 SURGICAL SUPPLY — 18 items
APPLICATOR COTTON TIP 6 STRL (MISCELLANEOUS) IMPLANT
APPLICATOR COTTON TIP 6IN STRL (MISCELLANEOUS) ×2
BLADE SURG 15 STRL LF DISP TIS (BLADE) ×1 IMPLANT
BLADE SURG 15 STRL SS (BLADE) ×2
CATH ROBINSON RED A/P 16FR (CATHETERS) ×2 IMPLANT
GAUZE PACKING 1/2X5YD (GAUZE/BANDAGES/DRESSINGS) IMPLANT
GLOVE BIO SURGEON STRL SZ 6.5 (GLOVE) ×2 IMPLANT
GOWN STRL REUS W/ TWL LRG LVL3 (GOWN DISPOSABLE) ×2 IMPLANT
GOWN STRL REUS W/TWL LRG LVL3 (GOWN DISPOSABLE) ×4
HIBICLENS CHG 4% 4OZ (MISCELLANEOUS) ×2 IMPLANT
NEEDLE HYPO 22GX1.5 SAFETY (NEEDLE) ×2 IMPLANT
PACK VAGINAL WOMENS (CUSTOM PROCEDURE TRAY) ×2 IMPLANT
PAD OB MATERNITY 4.3X12.25 (PERSONAL CARE ITEMS) ×2 IMPLANT
PAD PREP 24X48 CUFFED NSTRL (MISCELLANEOUS) ×2 IMPLANT
SUT VICRYL RAPIDE 4/0 PS 2 (SUTURE) ×4 IMPLANT
SWAB COLLECTION DEVICE MRSA (MISCELLANEOUS) IMPLANT
SWAB CULTURE ESWAB REG 1ML (MISCELLANEOUS) IMPLANT
TOWEL OR 17X26 10 PK STRL BLUE (TOWEL DISPOSABLE) ×2 IMPLANT

## 2020-02-24 NOTE — Op Note (Signed)
02/24/2020  8:08 AM  PATIENT:  Autumn Duffy  44 y.o. female  PRE-OPERATIVE DIAGNOSIS: Left  BARTHOLIN'S ABSCESS  POST-OPERATIVE DIAGNOSIS:  Left BARTHOLIN'S ABSCESS  PROCEDURE:  Procedure(s): BARTHOLIN CYST MARSUPIALIZATION (Left)  SURGEON:  Surgeon(s) and Role:    Rogue Bussing, Lendon George, DO - Primary  ANESTHESIA:   MAC  EBL:  10 mL   Description of procedure: Pt was taken to the operating room where anesthesia was administered and found to be adequate.  She was prepped and draped in normal sterile fashion in dorsal lithotomy position.  Bartholin's abscess identified on left measuring approx 3x3cm.  Incision made and pus drained approx 10cc.  Prior culture already sent so not collected today.  Sterile q-tip used to examine extent of pocket, and hemostat used to enlarge opening.  Approx 8 interrupted 4-0 vicryl rapide sutures placed around approx 1.5cm incision attaching cyst wall to external skin to maintain patency and prevent reformation of abscess.  Excellent hemostatis noted.  Pt tolerated procedure well. Sponge, lap and needle counts correct x 2.  Pt taken to recovery in stable condition.  COUNTS:  YES   PLAN OF CARE: Discharge to home after PACU  PATIENT DISPOSITION:  PACU - hemodynamically stable.   Delay start of Pharmacological VTE agent (>24hrs) due to surgical blood loss or risk of bleeding: not applicable

## 2020-02-24 NOTE — Anesthesia Postprocedure Evaluation (Signed)
Anesthesia Post Note  Patient: Autumn Duffy  Procedure(s) Performed: BARTHOLIN CYST MARSUPIALIZATION (Left Vagina )     Patient location during evaluation: PACU Anesthesia Type: General Level of consciousness: awake and alert and oriented Pain management: pain level controlled Vital Signs Assessment: post-procedure vital signs reviewed and stable Respiratory status: spontaneous breathing, nonlabored ventilation and respiratory function stable Cardiovascular status: blood pressure returned to baseline Postop Assessment: no apparent nausea or vomiting Anesthetic complications: no    Last Vitals:  Vitals:   02/24/20 0806 02/24/20 0906  BP: 119/68 (!) 105/57  Pulse: 68 68  Resp: 10 18  Temp: 36.5 C   SpO2: 100% 100%    Last Pain:  Vitals:   02/24/20 0906  TempSrc:   PainSc: 0-No pain                 Kaylyn Layer

## 2020-02-24 NOTE — Transfer of Care (Signed)
  Last Vitals:  Vitals Value Taken Time  BP    Temp    Pulse 69 02/24/20 0807  Resp 10 02/24/20 0807  SpO2 100 % 02/24/20 0807  Vitals shown include unvalidated device data.  Last Pain:  Vitals:   02/24/20 0554  TempSrc:   PainSc: 0-No pain      Patients Stated Pain Goal: 4 (02/24/20 0554) Immediate Anesthesia Transfer of Care Note  Patient: Autumn Duffy  Procedure(s) Performed: Procedure(s) (LRB): BARTHOLIN CYST MARSUPIALIZATION (Left)  Patient Location: PACU  Anesthesia Type: General  Level of Consciousness:drowsy  Airway & Oxygen Therapy: Patient Spontanous Breathing and Patient connected to nasal cannula oxygen  Post-op Assessment: Report given to PACU RN and Post -op Vital signs reviewed and stable  Post vital signs: Reviewed and stable  Complications: No apparent anesthesia complications

## 2020-02-24 NOTE — Anesthesia Procedure Notes (Signed)
Procedure Name: LMA Insertion Date/Time: 02/24/2020 7:35 AM Performed by: Norva Pavlov, CRNA Pre-anesthesia Checklist: Patient identified, Emergency Drugs available, Suction available and Patient being monitored Patient Re-evaluated:Patient Re-evaluated prior to induction Oxygen Delivery Method: Circle system utilized Preoxygenation: Pre-oxygenation with 100% oxygen Induction Type: IV induction Ventilation: Mask ventilation without difficulty LMA: LMA inserted LMA Size: 4.0 Number of attempts: 1 Airway Equipment and Method: Bite block Placement Confirmation: positive ETCO2 Tube secured with: Tape Dental Injury: Teeth and Oropharynx as per pre-operative assessment

## 2020-02-24 NOTE — Discharge Instructions (Signed)

## 2020-02-24 NOTE — H&P (Signed)
44 y.o. [id number] presents for scheduled marsupialization of left Bartholin's gland abscess.  She reports a history of vulvar abscess in 2016 that required IR drainage and 3d IV antibiotics, culture showed E.Coli infection. Per records this was a right labial abscess, not Bartholin's gland abscess, but at the time, it was noted that she had an asymptomatic Bartholin's gland cyst on the left, this was not treated. This new complaint of a lump developing on the left began 02/16/2020,  She was seen in the office and large painful approx 5x4cm abscess underwent I&D and wrod catheter placed.  Pt received Bactrim given h.o prior abscess.  She returned 5 days later with word catheter having fallen out.  Pain was improved, but B'glad cyst had again enlarged and was moderately painful.  Past Medical History:  Diagnosis Date  . Bartholin's gland abscess   . Hypothyroidism    followed by pcp  . Wears contact lenses   Right labial abscess 2016 (not Bartholin's)  Past Surgical History:  Procedure Laterality Date  . CESAREAN SECTION  08/31/2008  @[facility]   . RETINAL DETACHMENT SURGERY Left 1985 approx.  . WISDOM TOOTH EXTRACTION  1999   IR drainage of right labial abscess Social History   Socioeconomic History  . Marital status: Married    Spouse name: Not on file  . Number of children: Not on file  . Years of education: Not on file  . Highest education level: Not on file  Occupational History  . Not on file  Tobacco Use  . Smoking status: Never Smoker  . Smokeless tobacco: Never Used  Substance and Sexual Activity  . Alcohol use: No  . Drug use: Never  . Sexual activity: Yes  Other Topics Concern  . Not on file  Social History Narrative  . Not on file   Social Determinants of Health   Financial Resource Strain:   . Difficulty of Paying Living Expenses:   Food Insecurity:   . Worried About in the Last Year:   . Programme researcher, broadcasting/film/video in the Last Year:   Transportation Needs:   .  Barista (Medical):   Freight forwarder Lack of Transportation (Non-Medical):   Physical Activity:   . Days of Exercise per Week:   . Minutes of Exercise per Session:   Stress:   . Feeling of Stress :   Social Connections:   . Frequency of Communication with Friends and Family:   . Frequency of Social Gatherings with Friends and Family:   . Attends Religious Services:   . Active Member of Clubs or Organizations:   . Attends [employer] Meetings:   Banker Marital Status:   Intimate Partner Violence:   . Fear of Current or Ex-Partner:   . Emotionally Abused:   [employer] Physically Abused:   . Sexually Abused:     No current facility-administered medications on file prior to encounter.   Current Outpatient Medications on File Prior to Encounter  Medication Sig Dispense Refill  . levothyroxine (SYNTHROID) 88 MCG tablet Take 88 mcg by mouth daily before breakfast.    . Multiple Vitamin (MULTIVITAMIN WITH MINERALS) TABS tablet Take 1 tablet by mouth daily.    Marland Kitchen sulfamethoxazole-trimethoprim (BACTRIM DS) 800-160 MG tablet Take 1 tablet by mouth 2 (two) times daily.      No Known Allergies  Vitals:   02/23/20 1414 02/24/20 0541  BP:  119/74  Pulse:  81  Resp:  16  Temp:  98.3 F (  36.8 C)  TempSrc:  Oral  SpO2:  100%  Weight: 73 kg 73.2 kg  Height: 5\' 8"  (1.727 m) 5\' 8"  (1.727 m)    Lungs: clear to ascultation Cor:  RRR Abdomen:  soft, nontender, nondistended. Ex:  no cords, erythema Pelvic:  Pending  A:  Admitted for marsupialization of left Bartholin's Gland abscess recurrent after I&D, word catheter and abx.   P: P: All risks, benefits and alternatives d/w patient and she desires to proceed.    Allyn Kenner

## 2020-05-17 DIAGNOSIS — U071 COVID-19: Secondary | ICD-10-CM | POA: Diagnosis not present

## 2020-06-05 DIAGNOSIS — Z111 Encounter for screening for respiratory tuberculosis: Secondary | ICD-10-CM | POA: Diagnosis not present

## 2020-06-25 DIAGNOSIS — Z0184 Encounter for antibody response examination: Secondary | ICD-10-CM | POA: Diagnosis not present

## 2020-06-25 DIAGNOSIS — Z0289 Encounter for other administrative examinations: Secondary | ICD-10-CM | POA: Diagnosis not present

## 2020-06-25 DIAGNOSIS — Z0000 Encounter for general adult medical examination without abnormal findings: Secondary | ICD-10-CM | POA: Diagnosis not present

## 2020-06-28 DIAGNOSIS — Z6831 Body mass index (BMI) 31.0-31.9, adult: Secondary | ICD-10-CM | POA: Diagnosis not present

## 2020-06-28 DIAGNOSIS — Z0000 Encounter for general adult medical examination without abnormal findings: Secondary | ICD-10-CM | POA: Diagnosis not present

## 2020-06-28 DIAGNOSIS — E063 Autoimmune thyroiditis: Secondary | ICD-10-CM | POA: Diagnosis not present

## 2020-06-28 DIAGNOSIS — E6609 Other obesity due to excess calories: Secondary | ICD-10-CM | POA: Diagnosis not present

## 2020-06-29 DIAGNOSIS — Z23 Encounter for immunization: Secondary | ICD-10-CM | POA: Diagnosis not present

## 2020-07-03 DIAGNOSIS — D2271 Melanocytic nevi of right lower limb, including hip: Secondary | ICD-10-CM | POA: Diagnosis not present

## 2020-07-03 DIAGNOSIS — D225 Melanocytic nevi of trunk: Secondary | ICD-10-CM | POA: Diagnosis not present

## 2020-07-03 DIAGNOSIS — L821 Other seborrheic keratosis: Secondary | ICD-10-CM | POA: Diagnosis not present

## 2020-07-03 DIAGNOSIS — D2272 Melanocytic nevi of left lower limb, including hip: Secondary | ICD-10-CM | POA: Diagnosis not present

## 2020-07-30 DIAGNOSIS — Z23 Encounter for immunization: Secondary | ICD-10-CM | POA: Diagnosis not present

## 2020-08-27 DIAGNOSIS — Z23 Encounter for immunization: Secondary | ICD-10-CM | POA: Diagnosis not present

## 2020-10-05 DIAGNOSIS — E063 Autoimmune thyroiditis: Secondary | ICD-10-CM | POA: Diagnosis not present

## 2020-11-08 ENCOUNTER — Ambulatory Visit: Payer: BC Managed Care – PPO | Attending: Internal Medicine

## 2020-11-08 DIAGNOSIS — Z23 Encounter for immunization: Secondary | ICD-10-CM

## 2020-11-08 NOTE — Progress Notes (Signed)
   Covid-19 Vaccination Clinic  Name:  SHENITA TREGO    MRN: [id number] DOB: 02/24/1976  11/08/2020  Ms. Vadala was observed post Covid-19 immunization for 15 minutes without incident. She was provided with Vaccine Information Sheet and instruction to access the V-Safe system.   Ms. Bracco was instructed to call 911 with any severe reactions post vaccine: Marland [employer] Difficulty breathing  . Swelling of face and throat  . A fast heartbeat  . A bad rash all over body  . Dizziness and weakness   Immunizations Administered    Name Date Dose VIS Date Route   Pfizer COVID-19 Vaccine 11/08/2020  4:09 PM 0.3 mL 09/26/2020 Intramuscular   Manufacturer: [employer], [employer]   Lot: [id number]   NDC: [address]-3

## 2020-12-25 DIAGNOSIS — Z01419 Encounter for gynecological examination (general) (routine) without abnormal findings: Secondary | ICD-10-CM | POA: Diagnosis not present

## 2020-12-25 DIAGNOSIS — Z124 Encounter for screening for malignant neoplasm of cervix: Secondary | ICD-10-CM | POA: Diagnosis not present

## 2020-12-25 DIAGNOSIS — Z1231 Encounter for screening mammogram for malignant neoplasm of breast: Secondary | ICD-10-CM | POA: Diagnosis not present

## 2020-12-31 DIAGNOSIS — Z23 Encounter for immunization: Secondary | ICD-10-CM | POA: Diagnosis not present

## 2021-07-02 DIAGNOSIS — Z1389 Encounter for screening for other disorder: Secondary | ICD-10-CM | POA: Diagnosis not present

## 2021-07-02 DIAGNOSIS — E663 Overweight: Secondary | ICD-10-CM | POA: Diagnosis not present

## 2021-07-02 DIAGNOSIS — Z6826 Body mass index (BMI) 26.0-26.9, adult: Secondary | ICD-10-CM | POA: Diagnosis not present

## 2021-07-02 DIAGNOSIS — E039 Hypothyroidism, unspecified: Secondary | ICD-10-CM | POA: Diagnosis not present

## 2021-07-02 DIAGNOSIS — Z0000 Encounter for general adult medical examination without abnormal findings: Secondary | ICD-10-CM | POA: Diagnosis not present

## 2021-07-02 DIAGNOSIS — Z1331 Encounter for screening for depression: Secondary | ICD-10-CM | POA: Diagnosis not present

## 2021-07-08 DIAGNOSIS — L821 Other seborrheic keratosis: Secondary | ICD-10-CM | POA: Diagnosis not present

## 2021-07-08 DIAGNOSIS — D2272 Melanocytic nevi of left lower limb, including hip: Secondary | ICD-10-CM | POA: Diagnosis not present

## 2021-07-08 DIAGNOSIS — D225 Melanocytic nevi of trunk: Secondary | ICD-10-CM | POA: Diagnosis not present

## 2021-07-08 DIAGNOSIS — L814 Other melanin hyperpigmentation: Secondary | ICD-10-CM | POA: Diagnosis not present

## 2021-08-23 DIAGNOSIS — Z23 Encounter for immunization: Secondary | ICD-10-CM | POA: Diagnosis not present

## 2021-09-06 DIAGNOSIS — Z23 Encounter for immunization: Secondary | ICD-10-CM | POA: Diagnosis not present

## 2021-11-04 ENCOUNTER — Other Ambulatory Visit: Payer: Self-pay | Admitting: Obstetrics & Gynecology

## 2021-11-04 DIAGNOSIS — Z1231 Encounter for screening mammogram for malignant neoplasm of breast: Secondary | ICD-10-CM

## 2022-01-02 ENCOUNTER — Ambulatory Visit: Payer: BC Managed Care – PPO

## 2022-01-02 DIAGNOSIS — Z1231 Encounter for screening mammogram for malignant neoplasm of breast: Secondary | ICD-10-CM | POA: Diagnosis not present

## 2022-01-02 DIAGNOSIS — Z6825 Body mass index (BMI) 25.0-25.9, adult: Secondary | ICD-10-CM | POA: Diagnosis not present

## 2022-01-02 DIAGNOSIS — Z01419 Encounter for gynecological examination (general) (routine) without abnormal findings: Secondary | ICD-10-CM | POA: Diagnosis not present

## 2022-01-03 ENCOUNTER — Other Ambulatory Visit: Payer: Self-pay | Admitting: Obstetrics & Gynecology

## 2022-01-03 DIAGNOSIS — R928 Other abnormal and inconclusive findings on diagnostic imaging of breast: Secondary | ICD-10-CM

## 2022-01-30 ENCOUNTER — Other Ambulatory Visit: Payer: BC Managed Care – PPO

## 2022-01-31 ENCOUNTER — Other Ambulatory Visit: Payer: Self-pay

## 2022-01-31 ENCOUNTER — Ambulatory Visit
Admission: RE | Admit: 2022-01-31 | Discharge: 2022-01-31 | Disposition: A | Discharge status: Home / Self Care | Payer: BC Managed Care – PPO | Source: Ambulatory Visit | Attending: Obstetrics & Gynecology | Admitting: Obstetrics & Gynecology

## 2022-01-31 ENCOUNTER — Ambulatory Visit: Payer: BC Managed Care – PPO

## 2022-01-31 DIAGNOSIS — R922 Inconclusive mammogram: Secondary | ICD-10-CM | POA: Diagnosis not present

## 2022-01-31 DIAGNOSIS — R928 Other abnormal and inconclusive findings on diagnostic imaging of breast: Secondary | ICD-10-CM

## 2022-01-31 DIAGNOSIS — N6489 Other specified disorders of breast: Secondary | ICD-10-CM | POA: Diagnosis not present

## 2022-05-09 DIAGNOSIS — Z6825 Body mass index (BMI) 25.0-25.9, adult: Secondary | ICD-10-CM | POA: Diagnosis not present

## 2022-05-09 DIAGNOSIS — J069 Acute upper respiratory infection, unspecified: Secondary | ICD-10-CM | POA: Diagnosis not present

## 2022-05-09 DIAGNOSIS — H66001 Acute suppurative otitis media without spontaneous rupture of ear drum, right ear: Secondary | ICD-10-CM | POA: Diagnosis not present

## 2022-07-04 DIAGNOSIS — E039 Hypothyroidism, unspecified: Secondary | ICD-10-CM | POA: Diagnosis not present

## 2022-07-04 DIAGNOSIS — J069 Acute upper respiratory infection, unspecified: Secondary | ICD-10-CM | POA: Diagnosis not present

## 2022-07-04 DIAGNOSIS — Z6824 Body mass index (BMI) 24.0-24.9, adult: Secondary | ICD-10-CM | POA: Diagnosis not present

## 2022-07-04 DIAGNOSIS — Z0001 Encounter for general adult medical examination with abnormal findings: Secondary | ICD-10-CM | POA: Diagnosis not present

## 2022-07-04 DIAGNOSIS — H6506 Acute serous otitis media, recurrent, bilateral: Secondary | ICD-10-CM | POA: Diagnosis not present

## 2022-07-04 DIAGNOSIS — Z1331 Encounter for screening for depression: Secondary | ICD-10-CM | POA: Diagnosis not present

## 2022-07-08 DIAGNOSIS — D225 Melanocytic nevi of trunk: Secondary | ICD-10-CM | POA: Diagnosis not present

## 2022-07-08 DIAGNOSIS — D2272 Melanocytic nevi of left lower limb, including hip: Secondary | ICD-10-CM | POA: Diagnosis not present

## 2022-07-08 DIAGNOSIS — D2271 Melanocytic nevi of right lower limb, including hip: Secondary | ICD-10-CM | POA: Diagnosis not present

## 2022-07-08 DIAGNOSIS — L821 Other seborrheic keratosis: Secondary | ICD-10-CM | POA: Diagnosis not present

## 2022-09-19 DIAGNOSIS — Z23 Encounter for immunization: Secondary | ICD-10-CM | POA: Diagnosis not present

## 2022-09-23 IMAGING — MG MM DIGITAL DIAGNOSTIC UNILAT*L* W/ TOMO W/ CAD
4 series · 4 of 12 positions shown · non-contrast
Comparison: Previous exam(s).

CLINICAL DATA: The patient was called back for a left breast
asymmetry.

EXAM:
DIGITAL DIAGNOSTIC UNILATERAL LEFT MAMMOGRAM WITH TOMOSYNTHESIS AND
CAD
TECHNIQUE: Left digital diagnostic mammography and breast tomosynthesis was
performed. The images were evaluated with computer-aided detection.

[L MLO synth-2D]
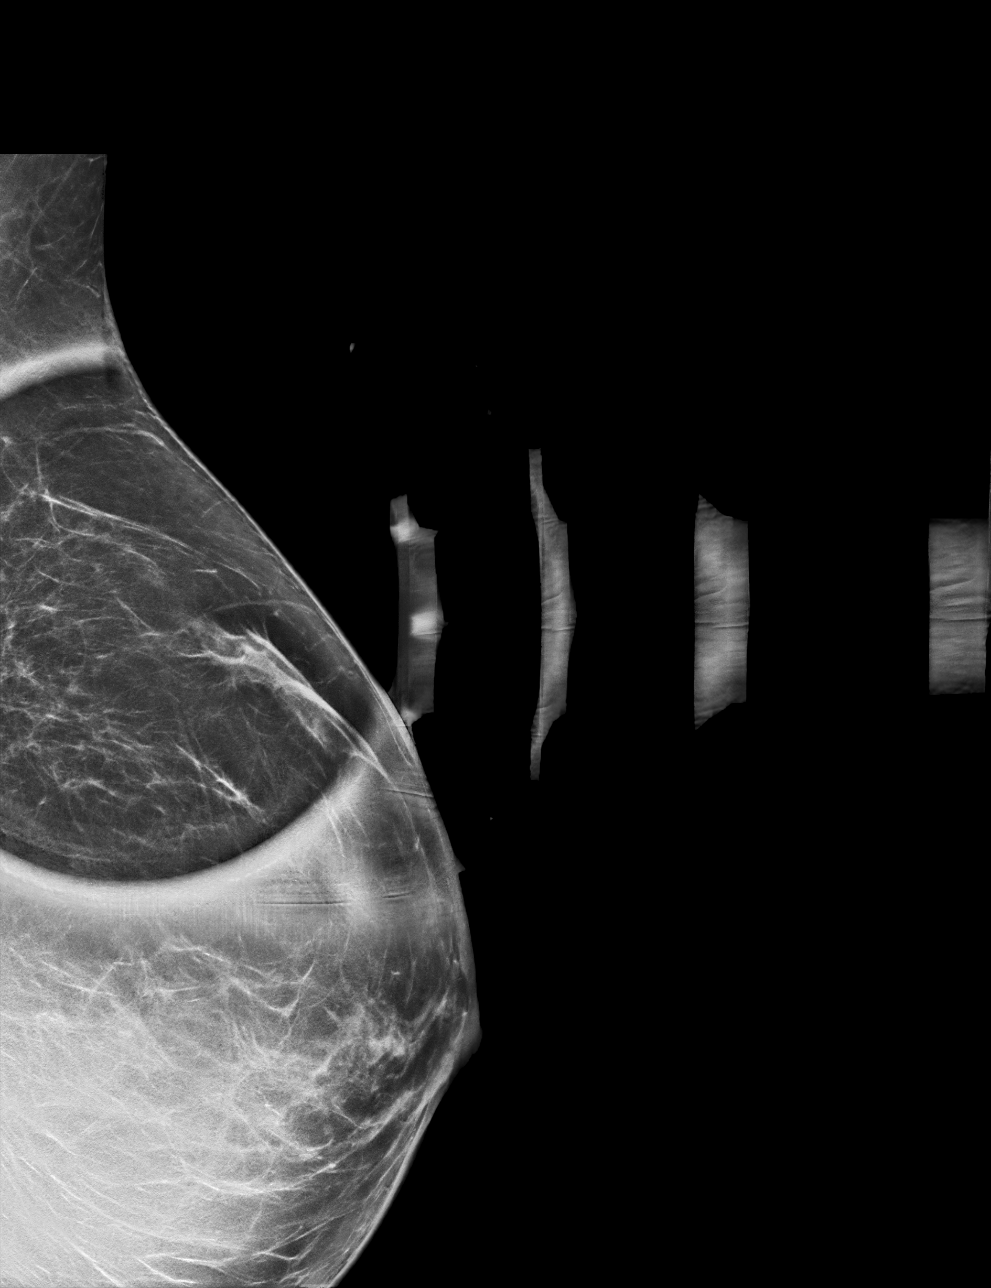

[L ML synth-2D]
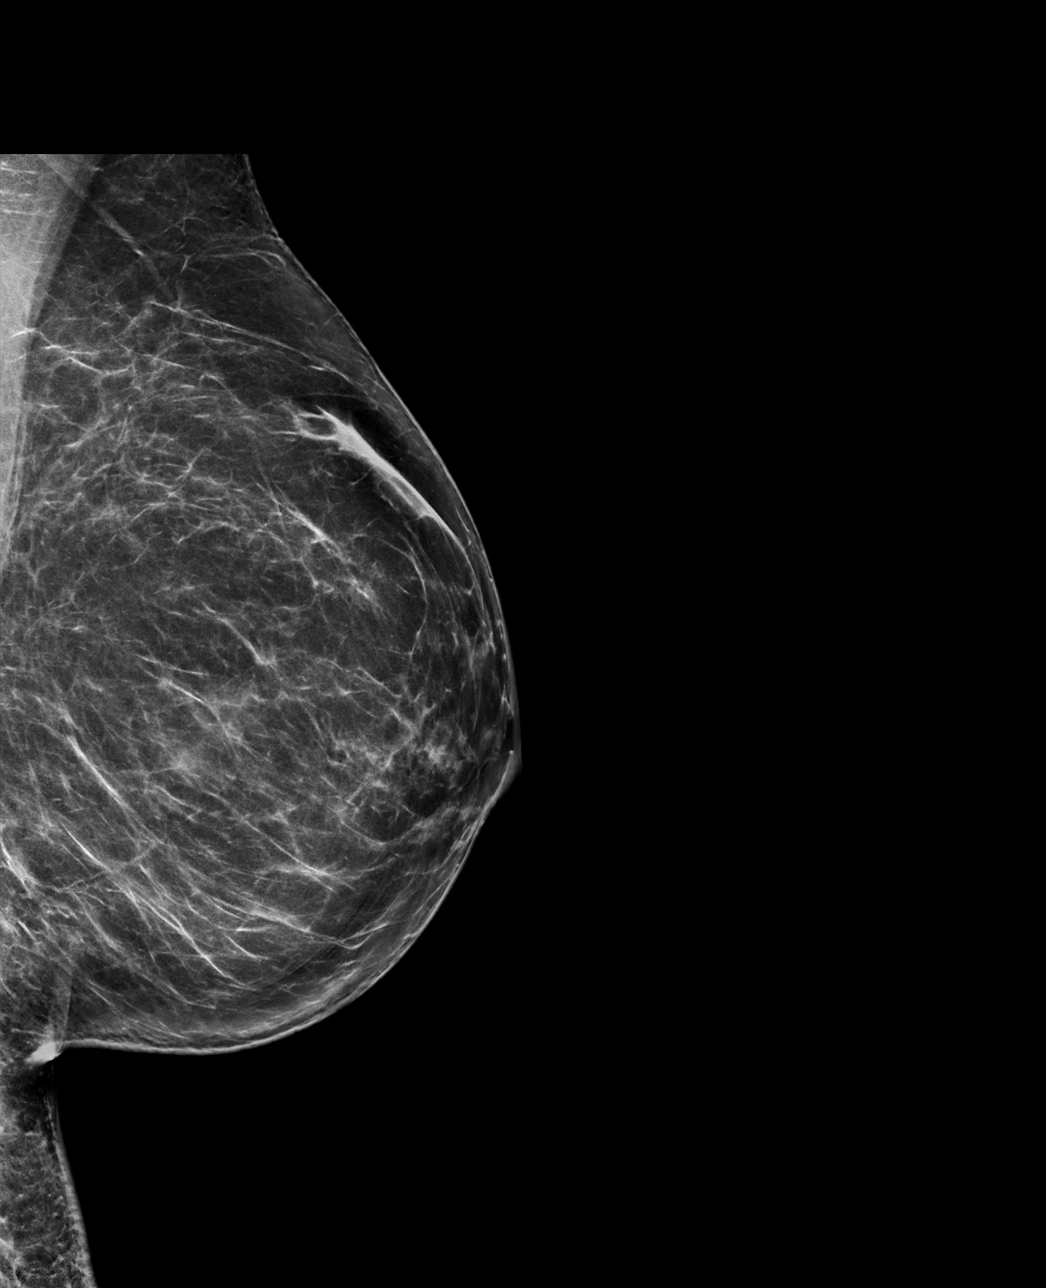

[L ML tomo · tomo slice 37/73.0]
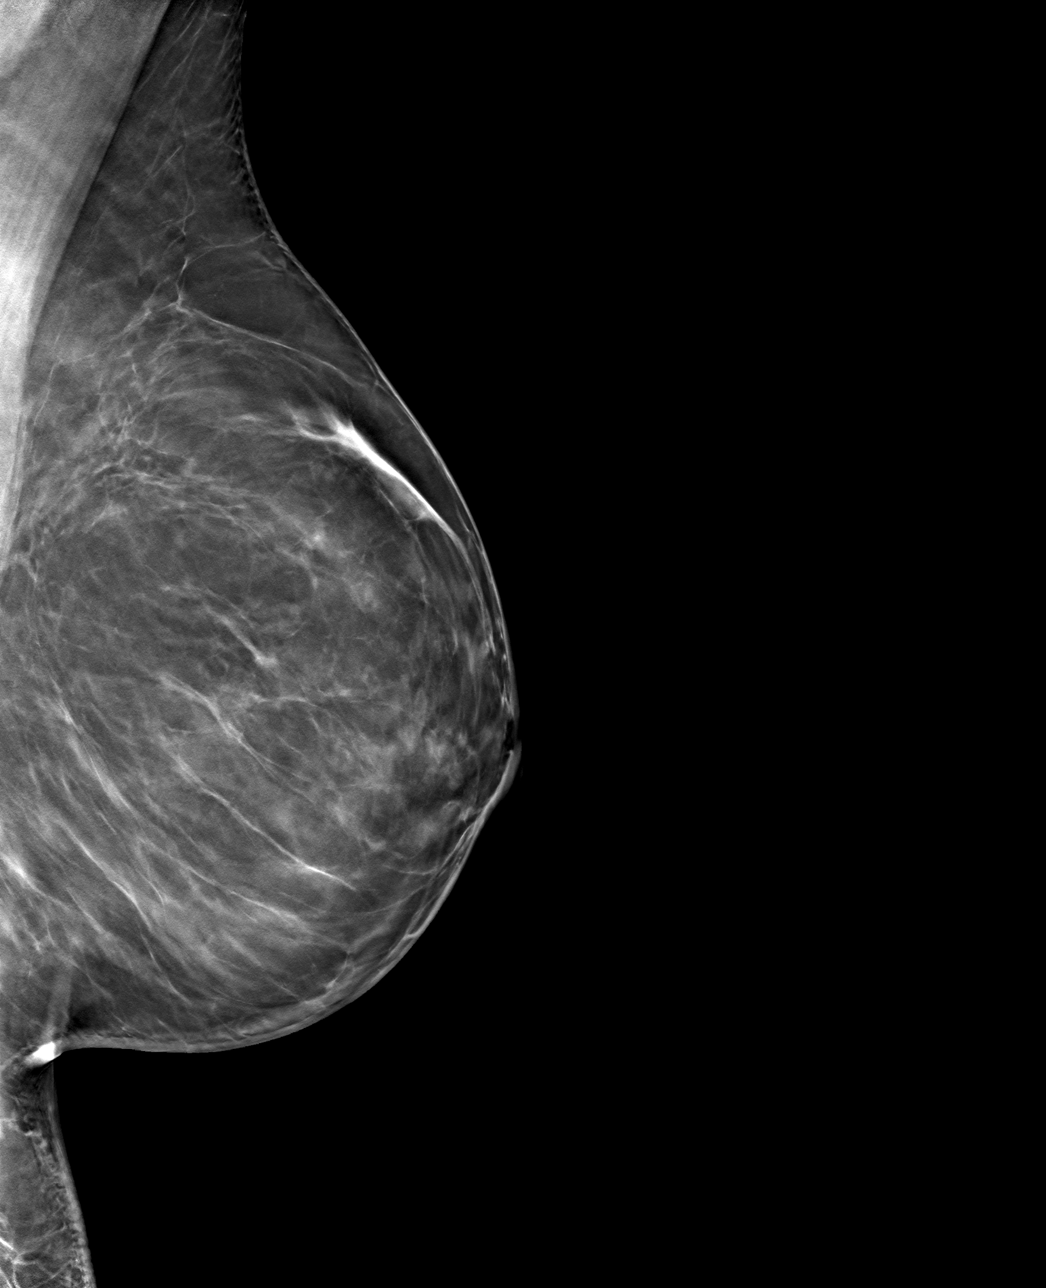

[L MLO tomo · tomo slice 35/68.0]
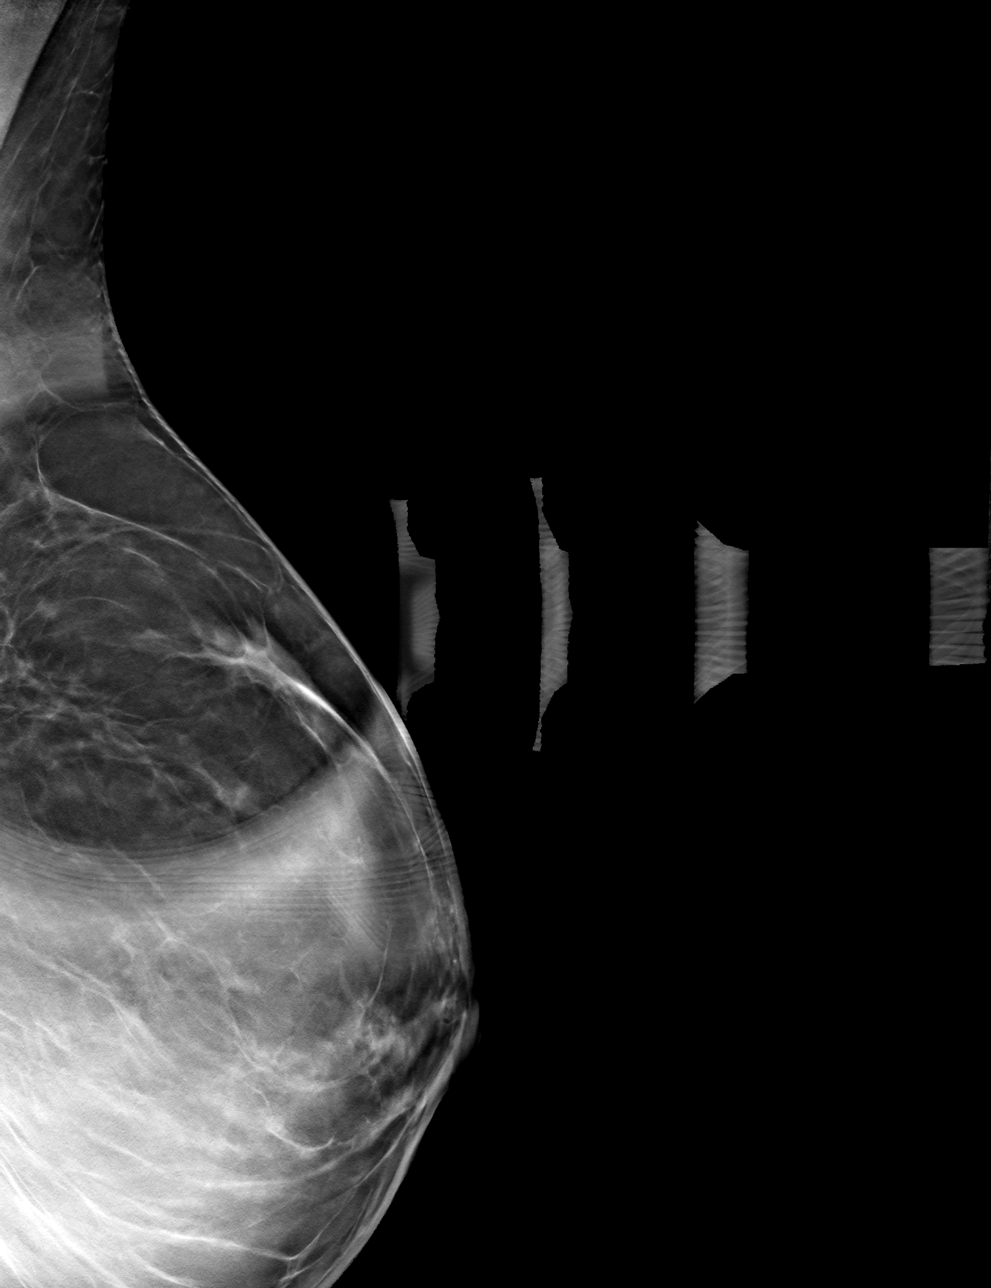

[4 of 12 positions shown; findings below may reference images not displayed]

ACR Breast Density Category b: There are scattered areas of
fibroglandular density.
FINDINGS: The left breast asymmetry resolves on today's imaging.
IMPRESSION: No mammographic evidence of malignancy.

RECOMMENDATION:
Annual screening mammography.

I have discussed the findings and recommendations with the patient.
If applicable, a reminder letter will be sent to the patient
regarding the next appointment.

BI-RADS CATEGORY  2: Benign.

## 2022-10-01 DIAGNOSIS — H6981 Other specified disorders of Eustachian tube, right ear: Secondary | ICD-10-CM | POA: Diagnosis not present

## 2022-10-01 DIAGNOSIS — H6521 Chronic serous otitis media, right ear: Secondary | ICD-10-CM | POA: Diagnosis not present

## 2022-10-28 DIAGNOSIS — Z111 Encounter for screening for respiratory tuberculosis: Secondary | ICD-10-CM | POA: Diagnosis not present

## 2022-11-06 DIAGNOSIS — Z1211 Encounter for screening for malignant neoplasm of colon: Secondary | ICD-10-CM | POA: Diagnosis not present

## 2022-11-06 DIAGNOSIS — K5730 Diverticulosis of large intestine without perforation or abscess without bleeding: Secondary | ICD-10-CM | POA: Diagnosis not present

## 2022-11-06 DIAGNOSIS — K648 Other hemorrhoids: Secondary | ICD-10-CM | POA: Diagnosis not present

## 2022-12-29 DIAGNOSIS — Z1231 Encounter for screening mammogram for malignant neoplasm of breast: Secondary | ICD-10-CM | POA: Diagnosis not present

## 2022-12-29 DIAGNOSIS — R92323 Mammographic fibroglandular density, bilateral breasts: Secondary | ICD-10-CM | POA: Diagnosis not present

## 2023-01-05 DIAGNOSIS — Z01419 Encounter for gynecological examination (general) (routine) without abnormal findings: Secondary | ICD-10-CM | POA: Diagnosis not present

## 2023-01-05 DIAGNOSIS — Z6825 Body mass index (BMI) 25.0-25.9, adult: Secondary | ICD-10-CM | POA: Diagnosis not present

## 2023-04-14 DIAGNOSIS — Z6825 Body mass index (BMI) 25.0-25.9, adult: Secondary | ICD-10-CM | POA: Diagnosis not present

## 2023-04-14 DIAGNOSIS — H6501 Acute serous otitis media, right ear: Secondary | ICD-10-CM | POA: Diagnosis not present

## 2023-04-14 DIAGNOSIS — H6506 Acute serous otitis media, recurrent, bilateral: Secondary | ICD-10-CM | POA: Diagnosis not present

## 2023-04-14 DIAGNOSIS — E663 Overweight: Secondary | ICD-10-CM | POA: Diagnosis not present

## 2023-04-24 DIAGNOSIS — M79644 Pain in right finger(s): Secondary | ICD-10-CM | POA: Diagnosis not present

## 2023-05-05 DIAGNOSIS — M25641 Stiffness of right hand, not elsewhere classified: Secondary | ICD-10-CM | POA: Diagnosis not present

## 2023-05-07 DIAGNOSIS — M65841 Other synovitis and tenosynovitis, right hand: Secondary | ICD-10-CM | POA: Diagnosis not present

## 2023-05-08 DIAGNOSIS — M79641 Pain in right hand: Secondary | ICD-10-CM | POA: Diagnosis not present

## 2023-05-09 DIAGNOSIS — M79641 Pain in right hand: Secondary | ICD-10-CM | POA: Diagnosis not present

## 2023-05-14 DIAGNOSIS — M65841 Other synovitis and tenosynovitis, right hand: Secondary | ICD-10-CM | POA: Diagnosis not present

## 2023-06-18 DIAGNOSIS — M65841 Other synovitis and tenosynovitis, right hand: Secondary | ICD-10-CM | POA: Diagnosis not present

## 2023-07-09 DIAGNOSIS — M79644 Pain in right finger(s): Secondary | ICD-10-CM | POA: Diagnosis not present

## 2023-07-09 DIAGNOSIS — Z0001 Encounter for general adult medical examination with abnormal findings: Secondary | ICD-10-CM | POA: Diagnosis not present

## 2023-07-09 DIAGNOSIS — E039 Hypothyroidism, unspecified: Secondary | ICD-10-CM | POA: Diagnosis not present

## 2023-07-09 DIAGNOSIS — Z1331 Encounter for screening for depression: Secondary | ICD-10-CM | POA: Diagnosis not present

## 2023-07-09 DIAGNOSIS — H6506 Acute serous otitis media, recurrent, bilateral: Secondary | ICD-10-CM | POA: Diagnosis not present

## 2023-07-09 DIAGNOSIS — R718 Other abnormality of red blood cells: Secondary | ICD-10-CM | POA: Diagnosis not present

## 2023-07-09 DIAGNOSIS — E663 Overweight: Secondary | ICD-10-CM | POA: Diagnosis not present

## 2023-07-09 DIAGNOSIS — Z6826 Body mass index (BMI) 26.0-26.9, adult: Secondary | ICD-10-CM | POA: Diagnosis not present

## 2023-07-20 DIAGNOSIS — M79641 Pain in right hand: Secondary | ICD-10-CM | POA: Diagnosis not present

## 2023-07-20 DIAGNOSIS — M65841 Other synovitis and tenosynovitis, right hand: Secondary | ICD-10-CM | POA: Diagnosis not present

## 2023-08-06 DIAGNOSIS — L812 Freckles: Secondary | ICD-10-CM | POA: Diagnosis not present

## 2023-08-06 DIAGNOSIS — D225 Melanocytic nevi of trunk: Secondary | ICD-10-CM | POA: Diagnosis not present

## 2023-08-06 DIAGNOSIS — D485 Neoplasm of uncertain behavior of skin: Secondary | ICD-10-CM | POA: Diagnosis not present

## 2023-08-06 DIAGNOSIS — L821 Other seborrheic keratosis: Secondary | ICD-10-CM | POA: Diagnosis not present

## 2023-08-13 DIAGNOSIS — M65841 Other synovitis and tenosynovitis, right hand: Secondary | ICD-10-CM | POA: Diagnosis not present

## 2023-08-13 DIAGNOSIS — Z113 Encounter for screening for infections with a predominantly sexual mode of transmission: Secondary | ICD-10-CM | POA: Diagnosis not present

## 2023-08-13 DIAGNOSIS — R1031 Right lower quadrant pain: Secondary | ICD-10-CM | POA: Diagnosis not present

## 2023-08-17 DIAGNOSIS — D509 Iron deficiency anemia, unspecified: Secondary | ICD-10-CM | POA: Diagnosis not present

## 2023-08-25 DIAGNOSIS — L989 Disorder of the skin and subcutaneous tissue, unspecified: Secondary | ICD-10-CM | POA: Diagnosis not present

## 2023-08-25 DIAGNOSIS — L929 Granulomatous disorder of the skin and subcutaneous tissue, unspecified: Secondary | ICD-10-CM | POA: Diagnosis not present

## 2023-08-25 DIAGNOSIS — M65841 Other synovitis and tenosynovitis, right hand: Secondary | ICD-10-CM | POA: Diagnosis not present

## 2023-08-25 DIAGNOSIS — B49 Unspecified mycosis: Secondary | ICD-10-CM | POA: Diagnosis not present

## 2023-09-07 DIAGNOSIS — M25641 Stiffness of right hand, not elsewhere classified: Secondary | ICD-10-CM | POA: Diagnosis not present

## 2023-09-14 DIAGNOSIS — M25641 Stiffness of right hand, not elsewhere classified: Secondary | ICD-10-CM | POA: Diagnosis not present

## 2023-09-16 DIAGNOSIS — M25641 Stiffness of right hand, not elsewhere classified: Secondary | ICD-10-CM | POA: Diagnosis not present

## 2023-09-21 DIAGNOSIS — M25641 Stiffness of right hand, not elsewhere classified: Secondary | ICD-10-CM | POA: Diagnosis not present

## 2023-09-23 DIAGNOSIS — M25641 Stiffness of right hand, not elsewhere classified: Secondary | ICD-10-CM | POA: Diagnosis not present

## 2023-09-24 DIAGNOSIS — Z23 Encounter for immunization: Secondary | ICD-10-CM | POA: Diagnosis not present

## 2023-09-28 DIAGNOSIS — M25641 Stiffness of right hand, not elsewhere classified: Secondary | ICD-10-CM | POA: Diagnosis not present

## 2023-09-30 DIAGNOSIS — M25641 Stiffness of right hand, not elsewhere classified: Secondary | ICD-10-CM | POA: Diagnosis not present

## 2023-10-05 DIAGNOSIS — M25641 Stiffness of right hand, not elsewhere classified: Secondary | ICD-10-CM | POA: Diagnosis not present

## 2023-10-07 DIAGNOSIS — M25641 Stiffness of right hand, not elsewhere classified: Secondary | ICD-10-CM | POA: Diagnosis not present

## 2023-10-12 DIAGNOSIS — M25641 Stiffness of right hand, not elsewhere classified: Secondary | ICD-10-CM | POA: Diagnosis not present

## 2023-10-14 DIAGNOSIS — M25641 Stiffness of right hand, not elsewhere classified: Secondary | ICD-10-CM | POA: Diagnosis not present

## 2023-10-19 DIAGNOSIS — M25641 Stiffness of right hand, not elsewhere classified: Secondary | ICD-10-CM | POA: Diagnosis not present

## 2023-10-21 DIAGNOSIS — M25641 Stiffness of right hand, not elsewhere classified: Secondary | ICD-10-CM | POA: Diagnosis not present

## 2023-10-26 DIAGNOSIS — M25641 Stiffness of right hand, not elsewhere classified: Secondary | ICD-10-CM | POA: Diagnosis not present

## 2023-10-28 DIAGNOSIS — M25641 Stiffness of right hand, not elsewhere classified: Secondary | ICD-10-CM | POA: Diagnosis not present

## 2023-11-02 DIAGNOSIS — M25641 Stiffness of right hand, not elsewhere classified: Secondary | ICD-10-CM | POA: Diagnosis not present

## 2023-11-12 DIAGNOSIS — M25641 Stiffness of right hand, not elsewhere classified: Secondary | ICD-10-CM | POA: Diagnosis not present

## 2023-11-13 DIAGNOSIS — M25641 Stiffness of right hand, not elsewhere classified: Secondary | ICD-10-CM | POA: Diagnosis not present

## 2023-11-16 DIAGNOSIS — M25641 Stiffness of right hand, not elsewhere classified: Secondary | ICD-10-CM | POA: Diagnosis not present

## 2023-11-18 DIAGNOSIS — M25641 Stiffness of right hand, not elsewhere classified: Secondary | ICD-10-CM | POA: Diagnosis not present

## 2023-11-23 DIAGNOSIS — M25641 Stiffness of right hand, not elsewhere classified: Secondary | ICD-10-CM | POA: Diagnosis not present

## 2023-11-25 DIAGNOSIS — M25641 Stiffness of right hand, not elsewhere classified: Secondary | ICD-10-CM | POA: Diagnosis not present

## 2023-11-30 DIAGNOSIS — M25641 Stiffness of right hand, not elsewhere classified: Secondary | ICD-10-CM | POA: Diagnosis not present

## 2023-12-14 DIAGNOSIS — M25641 Stiffness of right hand, not elsewhere classified: Secondary | ICD-10-CM | POA: Diagnosis not present

## 2023-12-16 DIAGNOSIS — Z4789 Encounter for other orthopedic aftercare: Secondary | ICD-10-CM | POA: Diagnosis not present

## 2023-12-16 DIAGNOSIS — M25641 Stiffness of right hand, not elsewhere classified: Secondary | ICD-10-CM | POA: Diagnosis not present

## 2023-12-16 DIAGNOSIS — M65841 Other synovitis and tenosynovitis, right hand: Secondary | ICD-10-CM | POA: Diagnosis not present

## 2023-12-31 DIAGNOSIS — Z1231 Encounter for screening mammogram for malignant neoplasm of breast: Secondary | ICD-10-CM | POA: Diagnosis not present

## 2023-12-31 DIAGNOSIS — R92323 Mammographic fibroglandular density, bilateral breasts: Secondary | ICD-10-CM | POA: Diagnosis not present

## 2024-01-15 DIAGNOSIS — Z01419 Encounter for gynecological examination (general) (routine) without abnormal findings: Secondary | ICD-10-CM | POA: Diagnosis not present

## 2024-01-15 DIAGNOSIS — Z124 Encounter for screening for malignant neoplasm of cervix: Secondary | ICD-10-CM | POA: Diagnosis not present

## 2024-02-05 DIAGNOSIS — E039 Hypothyroidism, unspecified: Secondary | ICD-10-CM | POA: Diagnosis not present

## 2024-02-05 DIAGNOSIS — D509 Iron deficiency anemia, unspecified: Secondary | ICD-10-CM | POA: Diagnosis not present

## 2024-02-05 DIAGNOSIS — R195 Other fecal abnormalities: Secondary | ICD-10-CM | POA: Diagnosis not present

## 2024-05-12 DIAGNOSIS — Z4789 Encounter for other orthopedic aftercare: Secondary | ICD-10-CM | POA: Diagnosis not present

## 2024-06-13 DIAGNOSIS — Z111 Encounter for screening for respiratory tuberculosis: Secondary | ICD-10-CM | POA: Diagnosis not present

## 2024-06-13 DIAGNOSIS — Z0001 Encounter for general adult medical examination with abnormal findings: Secondary | ICD-10-CM | POA: Diagnosis not present

## 2024-07-15 DIAGNOSIS — E663 Overweight: Secondary | ICD-10-CM | POA: Diagnosis not present

## 2024-07-15 DIAGNOSIS — Z0001 Encounter for general adult medical examination with abnormal findings: Secondary | ICD-10-CM | POA: Diagnosis not present

## 2024-07-15 DIAGNOSIS — E039 Hypothyroidism, unspecified: Secondary | ICD-10-CM | POA: Diagnosis not present

## 2024-07-15 DIAGNOSIS — M79671 Pain in right foot: Secondary | ICD-10-CM | POA: Diagnosis not present

## 2024-07-15 DIAGNOSIS — K5900 Constipation, unspecified: Secondary | ICD-10-CM | POA: Diagnosis not present

## 2024-07-15 DIAGNOSIS — Z6825 Body mass index (BMI) 25.0-25.9, adult: Secondary | ICD-10-CM | POA: Diagnosis not present

## 2024-07-15 DIAGNOSIS — D509 Iron deficiency anemia, unspecified: Secondary | ICD-10-CM | POA: Diagnosis not present

## 2024-07-15 DIAGNOSIS — Z1331 Encounter for screening for depression: Secondary | ICD-10-CM | POA: Diagnosis not present

## 2024-07-15 DIAGNOSIS — Z0289 Encounter for other administrative examinations: Secondary | ICD-10-CM | POA: Diagnosis not present

## 2024-08-09 DIAGNOSIS — D225 Melanocytic nevi of trunk: Secondary | ICD-10-CM | POA: Diagnosis not present

## 2024-08-09 DIAGNOSIS — L812 Freckles: Secondary | ICD-10-CM | POA: Diagnosis not present

## 2024-09-21 DIAGNOSIS — N751 Abscess of Bartholin's gland: Secondary | ICD-10-CM | POA: Diagnosis not present

## 2024-12-22 ENCOUNTER — Other Ambulatory Visit (HOSPITAL_COMMUNITY): Payer: Self-pay | Admitting: Family Medicine

## 2024-12-22 DIAGNOSIS — Z8249 Family history of ischemic heart disease and other diseases of the circulatory system: Secondary | ICD-10-CM

## 2024-12-26 ENCOUNTER — Ambulatory Visit
Admission: RE | Admit: 2024-12-26 | Discharge: 2024-12-26 | Disposition: A | Discharge status: Home / Self Care | Payer: Self-pay | Source: Ambulatory Visit | Attending: Family Medicine | Admitting: Family Medicine

## 2024-12-26 DIAGNOSIS — Z8249 Family history of ischemic heart disease and other diseases of the circulatory system: Secondary | ICD-10-CM | POA: Insufficient documentation
